# Patient Record
Sex: Female | Born: 1975 | Hispanic: Yes | Marital: Married | Smoking: Never smoker
Health system: Southern US, Community
[De-identification: ages and names within clinical notes are randomized; demographics above are authoritative.]

## PROBLEM LIST (undated history)

## (undated) ENCOUNTER — Inpatient Hospital Stay: Payer: Self-pay

## (undated) DIAGNOSIS — I959 Hypotension, unspecified: Secondary | ICD-10-CM

## (undated) HISTORY — DX: Hypotension, unspecified: I95.9

## (undated) HISTORY — PX: CHOLECYSTECTOMY: SHX55

---

## 2011-11-30 ENCOUNTER — Ambulatory Visit: Payer: Self-pay | Admitting: Family Medicine

## 2012-09-10 ENCOUNTER — Ambulatory Visit: Payer: Self-pay | Admitting: Family

## 2015-08-04 ENCOUNTER — Emergency Department: Payer: BLUE CROSS/BLUE SHIELD

## 2015-08-04 ENCOUNTER — Encounter: Payer: Self-pay | Admitting: Emergency Medicine

## 2015-08-04 ENCOUNTER — Emergency Department: Payer: BLUE CROSS/BLUE SHIELD | Admitting: Anesthesiology

## 2015-08-04 ENCOUNTER — Observation Stay
Admission: EM | Admit: 2015-08-04 | Discharge: 2015-08-06 | Disposition: A | Discharge status: Home / Self Care | Payer: BLUE CROSS/BLUE SHIELD | Attending: Surgery | Admitting: Surgery

## 2015-08-04 ENCOUNTER — Encounter
Admission: EM | Disposition: A | Discharge status: Home / Self Care | Payer: Self-pay | Source: Home / Self Care | Attending: Emergency Medicine

## 2015-08-04 ENCOUNTER — Ambulatory Visit: Admit: 2015-08-04 | Payer: Self-pay | Admitting: Surgery

## 2015-08-04 DIAGNOSIS — R1031 Right lower quadrant pain: Secondary | ICD-10-CM

## 2015-08-04 DIAGNOSIS — Z87891 Personal history of nicotine dependence: Secondary | ICD-10-CM | POA: Insufficient documentation

## 2015-08-04 DIAGNOSIS — O9989 Other specified diseases and conditions complicating pregnancy, childbirth and the puerperium: Secondary | ICD-10-CM

## 2015-08-04 DIAGNOSIS — Z3A16 16 weeks gestation of pregnancy: Secondary | ICD-10-CM | POA: Insufficient documentation

## 2015-08-04 DIAGNOSIS — O99891 Other specified diseases and conditions complicating pregnancy: Secondary | ICD-10-CM

## 2015-08-04 DIAGNOSIS — Z880 Allergy status to penicillin: Secondary | ICD-10-CM | POA: Diagnosis not present

## 2015-08-04 DIAGNOSIS — K3580 Unspecified acute appendicitis: Secondary | ICD-10-CM | POA: Diagnosis not present

## 2015-08-04 DIAGNOSIS — O99612 Diseases of the digestive system complicating pregnancy, second trimester: Secondary | ICD-10-CM | POA: Diagnosis not present

## 2015-08-04 DIAGNOSIS — K3589 Other acute appendicitis: Secondary | ICD-10-CM

## 2015-08-04 DIAGNOSIS — F17210 Nicotine dependence, cigarettes, uncomplicated: Secondary | ICD-10-CM | POA: Insufficient documentation

## 2015-08-04 DIAGNOSIS — R8271 Bacteriuria: Secondary | ICD-10-CM | POA: Insufficient documentation

## 2015-08-04 DIAGNOSIS — O26892 Other specified pregnancy related conditions, second trimester: Secondary | ICD-10-CM | POA: Diagnosis not present

## 2015-08-04 DIAGNOSIS — O99619 Diseases of the digestive system complicating pregnancy, unspecified trimester: Secondary | ICD-10-CM

## 2015-08-04 DIAGNOSIS — Z9049 Acquired absence of other specified parts of digestive tract: Secondary | ICD-10-CM | POA: Diagnosis not present

## 2015-08-04 HISTORY — PX: LAPAROSCOPIC APPENDECTOMY: SHX408

## 2015-08-04 LAB — URINALYSIS COMPLETE WITH MICROSCOPIC (ARMC ONLY)
BILIRUBIN URINE: NEGATIVE
Glucose, UA: NEGATIVE mg/dL
KETONES UR: NEGATIVE mg/dL
Nitrite: NEGATIVE
Protein, ur: NEGATIVE mg/dL
SPECIFIC GRAVITY, URINE: 1.004 — AB (ref 1.005–1.030)
pH: 6 (ref 5.0–8.0)

## 2015-08-04 LAB — CBC
HCT: 37.2 % (ref 35.0–47.0)
Hemoglobin: 12.8 g/dL (ref 12.0–16.0)
MCH: 30.7 pg (ref 26.0–34.0)
MCHC: 34.3 g/dL (ref 32.0–36.0)
MCV: 89.5 fL (ref 80.0–100.0)
PLATELETS: 254 10*3/uL (ref 150–440)
RBC: 4.16 MIL/uL (ref 3.80–5.20)
RDW: 13.3 % (ref 11.5–14.5)
WBC: 12.5 10*3/uL — ABNORMAL HIGH (ref 3.6–11.0)

## 2015-08-04 LAB — HCG, QUANTITATIVE, PREGNANCY: hCG, Beta Chain, Quant, S: 23780 m[IU]/mL — ABNORMAL HIGH (ref <5)

## 2015-08-04 LAB — WET PREP, GENITAL
Clue Cells Wet Prep HPF POC: NONE SEEN
TRICH WET PREP: NONE SEEN

## 2015-08-04 LAB — COMPREHENSIVE METABOLIC PANEL
ALBUMIN: 3.3 g/dL — AB (ref 3.5–5.0)
ALK PHOS: 63 U/L (ref 38–126)
ALT: 28 U/L (ref 14–54)
AST: 19 U/L (ref 15–41)
Anion gap: 7 (ref 5–15)
BUN: 8 mg/dL (ref 6–20)
CHLORIDE: 105 mmol/L (ref 101–111)
CO2: 23 mmol/L (ref 22–32)
CREATININE: 0.47 mg/dL (ref 0.44–1.00)
Calcium: 9 mg/dL (ref 8.9–10.3)
GFR calc non Af Amer: >60 mL/min (ref >60)
GLUCOSE: 93 mg/dL (ref 65–99)
Potassium: 3.5 mmol/L (ref 3.5–5.1)
SODIUM: 135 mmol/L (ref 135–145)
Total Bilirubin: 0.1 mg/dL — ABNORMAL LOW (ref 0.3–1.2)
Total Protein: 7.4 g/dL (ref 6.5–8.1)

## 2015-08-04 LAB — LIPASE, BLOOD: LIPASE: 28 U/L (ref 11–51)

## 2015-08-04 LAB — CHLAMYDIA/NGC RT PCR (ARMC ONLY)
CHLAMYDIA TR: NOT DETECTED
N GONORRHOEAE: NOT DETECTED

## 2015-08-04 SURGERY — APPENDECTOMY, LAPAROSCOPIC
Anesthesia: General | Site: Abdomen | Wound class: Contaminated

## 2015-08-04 MED ORDER — FENTANYL CITRATE (PF) 100 MCG/2ML IJ SOLN
INTRAMUSCULAR | Status: DC | PRN
  Administered 2015-08-04: 100 ug via INTRAVENOUS
  Administered 2015-08-04 (×2): 25 ug via INTRAVENOUS

## 2015-08-04 MED ORDER — CIPROFLOXACIN IN D5W 400 MG/200ML IV SOLN
400.0000 mg | Freq: Once | INTRAVENOUS | Status: AC
  Administered 2015-08-04: 400 mg via INTRAVENOUS
  Filled 2015-08-04: qty 200

## 2015-08-04 MED ORDER — ONDANSETRON HCL 4 MG/2ML IJ SOLN
4.0000 mg | Freq: Once | INTRAMUSCULAR | Status: AC | PRN
  Administered 2015-08-04: 4 mg via INTRAVENOUS

## 2015-08-04 MED ORDER — MORPHINE SULFATE (PF) 2 MG/ML IV SOLN
2.0000 mg | INTRAVENOUS | Status: DC | PRN
  Administered 2015-08-04 – 2015-08-05 (×3): 2 mg via INTRAVENOUS
  Filled 2015-08-04 (×3): qty 1

## 2015-08-04 MED ORDER — BUPIVACAINE-EPINEPHRINE (PF) 0.25% -1:200000 IJ SOLN
INTRAMUSCULAR | Status: DC | PRN
  Administered 2015-08-04: 30 mL

## 2015-08-04 MED ORDER — HYDROMORPHONE HCL 1 MG/ML IJ SOLN
0.5000 mg | Freq: Once | INTRAMUSCULAR | Status: AC
  Administered 2015-08-04: 0.5 mg via INTRAVENOUS

## 2015-08-04 MED ORDER — FENTANYL CITRATE (PF) 100 MCG/2ML IJ SOLN
INTRAMUSCULAR | Status: AC
  Filled 2015-08-04: qty 2

## 2015-08-04 MED ORDER — NITROFURANTOIN MONOHYD MACRO 100 MG PO CAPS: 100.0000 mg | ORAL_CAPSULE | Freq: Two times a day (BID) | ORAL | Status: AC

## 2015-08-04 MED ORDER — EPHEDRINE SULFATE 50 MG/ML IJ SOLN
INTRAMUSCULAR | Status: DC | PRN
  Administered 2015-08-04: 5 mg via INTRAVENOUS

## 2015-08-04 MED ORDER — SUCCINYLCHOLINE CHLORIDE 20 MG/ML IJ SOLN
INTRAMUSCULAR | Status: DC | PRN
  Administered 2015-08-04: 100 mg via INTRAVENOUS

## 2015-08-04 MED ORDER — ROCURONIUM BROMIDE 100 MG/10ML IV SOLN
INTRAVENOUS | Status: DC | PRN
  Administered 2015-08-04: 30 mg via INTRAVENOUS

## 2015-08-04 MED ORDER — METRONIDAZOLE IN NACL 5-0.79 MG/ML-% IV SOLN
500.0000 mg | Freq: Once | INTRAVENOUS | Status: AC
  Administered 2015-08-04: 500 mg via INTRAVENOUS
  Filled 2015-08-04: qty 100

## 2015-08-04 MED ORDER — ONDANSETRON HCL 4 MG PO TABS: 4.0000 mg | ORAL_TABLET | Freq: Four times a day (QID) | ORAL | Status: DC | PRN

## 2015-08-04 MED ORDER — HEPARIN SODIUM (PORCINE) 5000 UNIT/ML IJ SOLN: 5000.0000 [IU] | Freq: Three times a day (TID) | INTRAMUSCULAR | Status: DC

## 2015-08-04 MED ORDER — ACETAMINOPHEN 325 MG PO TABS
650.0000 mg | ORAL_TABLET | Freq: Once | ORAL | Status: AC
  Administered 2015-08-04: 650 mg via ORAL
  Filled 2015-08-04: qty 2

## 2015-08-04 MED ORDER — ONDANSETRON HCL 4 MG/2ML IJ SOLN
4.0000 mg | Freq: Once | INTRAMUSCULAR | Status: AC
  Administered 2015-08-04: 4 mg via INTRAVENOUS
  Filled 2015-08-04: qty 2

## 2015-08-04 MED ORDER — LIDOCAINE HCL (CARDIAC) 20 MG/ML IV SOLN
INTRAVENOUS | Status: DC | PRN
  Administered 2015-08-04: 40 mg via INTRAVENOUS

## 2015-08-04 MED ORDER — GLYCOPYRROLATE 0.2 MG/ML IJ SOLN
INTRAMUSCULAR | Status: DC | PRN
  Administered 2015-08-04: .6 mg via INTRAVENOUS

## 2015-08-04 MED ORDER — HEPARIN SODIUM (PORCINE) 5000 UNIT/ML IJ SOLN
5000.0000 [IU] | Freq: Three times a day (TID) | INTRAMUSCULAR | Status: DC
  Administered 2015-08-04 – 2015-08-06 (×4): 5000 [IU] via SUBCUTANEOUS
  Filled 2015-08-04 (×4): qty 1

## 2015-08-04 MED ORDER — LACTATED RINGERS IV SOLN
INTRAVENOUS | Status: DC | PRN
  Administered 2015-08-04: 13:00:00 via INTRAVENOUS

## 2015-08-04 MED ORDER — FENTANYL CITRATE (PF) 100 MCG/2ML IJ SOLN
25.0000 ug | INTRAMUSCULAR | Status: DC | PRN
  Administered 2015-08-04 (×4): 25 ug via INTRAVENOUS

## 2015-08-04 MED ORDER — PROPOFOL 10 MG/ML IV BOLUS
INTRAVENOUS | Status: DC | PRN
  Administered 2015-08-04: 130 mg via INTRAVENOUS

## 2015-08-04 MED ORDER — LACTATED RINGERS IV SOLN
INTRAVENOUS | Status: DC
  Administered 2015-08-04 – 2015-08-05 (×2): via INTRAVENOUS

## 2015-08-04 MED ORDER — NEOSTIGMINE METHYLSULFATE 10 MG/10ML IV SOLN
INTRAVENOUS | Status: DC | PRN
  Administered 2015-08-04: 4 mg via INTRAVENOUS

## 2015-08-04 MED ORDER — ONDANSETRON HCL 4 MG/2ML IJ SOLN
4.0000 mg | Freq: Four times a day (QID) | INTRAMUSCULAR | Status: DC | PRN
Start: 2015-08-04 — End: 2015-08-06

## 2015-08-04 MED ORDER — HYDROCODONE-ACETAMINOPHEN 5-325 MG PO TABS
1.0000 | ORAL_TABLET | ORAL | Status: DC | PRN
  Administered 2015-08-04: 2 via ORAL
  Administered 2015-08-04 – 2015-08-05 (×3): 1 via ORAL
  Administered 2015-08-05 (×2): 2 via ORAL
  Administered 2015-08-05 – 2015-08-06 (×2): 1 via ORAL
  Filled 2015-08-04: qty 2
  Filled 2015-08-04: qty 1
  Filled 2015-08-04 (×2): qty 2
  Filled 2015-08-04 (×2): qty 1
  Filled 2015-08-04: qty 2
  Filled 2015-08-04: qty 1

## 2015-08-04 MED ORDER — ONDANSETRON HCL 4 MG/2ML IJ SOLN
INTRAMUSCULAR | Status: AC
  Filled 2015-08-04: qty 2

## 2015-08-04 MED ORDER — SODIUM CHLORIDE 0.9 % IV BOLUS (SEPSIS)
1000.0000 mL | Freq: Once | INTRAVENOUS | Status: AC
  Administered 2015-08-04: 1000 mL via INTRAVENOUS

## 2015-08-04 MED ORDER — HYDROMORPHONE HCL 1 MG/ML IJ SOLN
1.0000 mg | Freq: Once | INTRAMUSCULAR | Status: DC
  Filled 2015-08-04: qty 1

## 2015-08-04 MED ORDER — NITROFURANTOIN MONOHYD MACRO 100 MG PO CAPS
100.0000 mg | ORAL_CAPSULE | Freq: Once | ORAL | Status: AC
  Administered 2015-08-04: 100 mg via ORAL
  Filled 2015-08-04: qty 1

## 2015-08-04 SURGICAL SUPPLY — 41 items
ADHESIVE MASTISOL STRL (MISCELLANEOUS) ×3 IMPLANT
APPLIER CLIP ROT 10 11.4 M/L (STAPLE) ×3
BLADE SURG SZ11 CARB STEEL (BLADE) ×3 IMPLANT
CANISTER SUCT 3000ML (MISCELLANEOUS) ×3 IMPLANT
CATH TRAY 16F METER LATEX (MISCELLANEOUS) ×3 IMPLANT
CHLORAPREP W/TINT 26ML (MISCELLANEOUS) ×3 IMPLANT
CLIP APPLIE ROT 10 11.4 M/L (STAPLE) ×1 IMPLANT
CLOSURE WOUND 1/2 X4 (GAUZE/BANDAGES/DRESSINGS) ×1
CUTTER LINEAR ENDO 35 ART THIN (STAPLE) ×3 IMPLANT
DEVICE TROCAR PUNCTURE CLOSURE (ENDOMECHANICALS) ×3 IMPLANT
ENDOPOUCH RETRIEVER 10 (MISCELLANEOUS) ×3 IMPLANT
GAUZE SPONGE NON-WVN 2X2 STRL (MISCELLANEOUS) ×3 IMPLANT
GLOVE BIO SURGEON STRL SZ8 (GLOVE) ×21 IMPLANT
GOWN STRL REUS W/ TWL LRG LVL3 (GOWN DISPOSABLE) ×4 IMPLANT
GOWN STRL REUS W/TWL LRG LVL3 (GOWN DISPOSABLE) ×8
IRRIGATION STRYKERFLOW (MISCELLANEOUS) ×1 IMPLANT
IRRIGATOR STRYKERFLOW (MISCELLANEOUS) ×3
KIT RM TURNOVER STRD PROC AR (KITS) ×3 IMPLANT
LABEL OR SOLS (LABEL) IMPLANT
NDL SAFETY 22GX1.5 (NEEDLE) ×3 IMPLANT
NEEDLE VERESS 14GA 120MM (NEEDLE) ×3 IMPLANT
NS IRRIG 500ML POUR BTL (IV SOLUTION) ×3 IMPLANT
PACK LAP CHOLECYSTECTOMY (MISCELLANEOUS) ×3 IMPLANT
PAD GROUND ADULT SPLIT (MISCELLANEOUS) ×3 IMPLANT
RELOAD /EVU35 (ENDOMECHANICALS) IMPLANT
RELOAD CUTTER ETS 35MM STAND (ENDOMECHANICALS) ×3 IMPLANT
SCISSORS METZENBAUM CVD 33 (INSTRUMENTS) ×3 IMPLANT
SLEEVE ENDOPATH XCEL 5M (ENDOMECHANICALS) ×3 IMPLANT
SOL .9 NS 3000ML IRR  AL (IV SOLUTION) ×2
SOL .9 NS 3000ML IRR UROMATIC (IV SOLUTION) ×1 IMPLANT
SPONGE LAP 18X18 5 PK (GAUZE/BANDAGES/DRESSINGS) ×3 IMPLANT
SPONGE VERSALON 2X2 STRL (MISCELLANEOUS) ×6
STRIP CLOSURE SKIN 1/2X4 (GAUZE/BANDAGES/DRESSINGS) ×2 IMPLANT
SUT MNCRL 4-0 (SUTURE) ×4
SUT MNCRL 4-0 27XMFL (SUTURE) ×2
SUT VICRYL 0 TIES 12 18 (SUTURE) ×3 IMPLANT
SUTURE MNCRL 4-0 27XMF (SUTURE) ×2 IMPLANT
TRAP SPECIMEN MUCOUS 40CC (MISCELLANEOUS) IMPLANT
TROCAR XCEL 12X100 BLDLESS (ENDOMECHANICALS) ×3 IMPLANT
TROCAR XCEL NON-BLD 5MMX100MML (ENDOMECHANICALS) ×3 IMPLANT
TUBING INSUFFLATOR HI FLOW (MISCELLANEOUS) ×3 IMPLANT

## 2015-08-04 NOTE — H&P (Signed)
Katherine Eaton is an 39 y.o. female.    Chief Complaint: Right lower quadrant pain  HPI: This patient with 3 days right lower quadrant pain which is been worsening she denies fevers or chills she is [redacted] weeks pregnant. No official interpreter was present but her husband was present and was able to interpret quite easily for me. A formal interpreter will be utilized for repeating consent issues later. She denies nausea or vomiting denies fevers or chills has never had an episode like this before this is her first pregnancy pain does not radiate denies dysuria  History reviewed. No pertinent past medical history.  Past Surgical History  Procedure Laterality Date  . Cholecystectomy      History reviewed. No pertinent family history. Social History:  reports that she has quit smoking. She does not have any smokeless tobacco history on file. She reports that she does not drink alcohol. Her drug history is not on file.  Allergies:  Allergies  Allergen Reactions  . Penicillins Anaphylaxis     (Not in a hospital admission)   Review of Systems  Constitutional: Negative for fever and chills.  HENT: Negative.   Eyes: Negative.   Respiratory: Negative.   Cardiovascular: Negative.   Gastrointestinal: Positive for abdominal pain. Negative for heartburn, nausea, vomiting, diarrhea, constipation, blood in stool and melena.  Genitourinary: Negative.  Negative for dysuria and urgency.  Musculoskeletal: Negative.   Neurological: Negative.   Endo/Heme/Allergies: Negative.   Psychiatric/Behavioral: Negative.      Physical Exam:  BP 113/72 mmHg  Pulse 77  Temp(Src) 98.1 F (36.7 C) (Oral)  Resp 18  Ht '5\' 2"'  (1.575 m)  Wt 169 lb 12.1 oz (77 kg)  BMI 31.04 kg/m2  SpO2 97%  LMP 04/14/2015  Physical Exam  Constitutional: She is oriented to person, place, and time and well-developed, well-nourished, and in no distress. No distress.  Appears comfortable  HENT:  Head:  Normocephalic and atraumatic.  Eyes: Pupils are equal, round, and reactive to light. Right eye exhibits no discharge. Left eye exhibits no discharge. No scleral icterus.  Neck: Normal range of motion. Neck supple.  Cardiovascular: Normal rate, regular rhythm and normal heart sounds.   Pulmonary/Chest: Effort normal and breath sounds normal. No respiratory distress. She has no wheezes. She has no rales.  Abdominal: Soft. She exhibits no distension. There is tenderness. There is no rebound and no guarding.  Tender right lower quadrant at McBurney's point fundus palpable in the infraumbilical area No guarding no rebound no percussion tenderness  Musculoskeletal: Normal range of motion. She exhibits no edema.  Lymphadenopathy:    She has no cervical adenopathy.  Neurological: She is alert and oriented to person, place, and time.  Skin: Skin is warm and dry. No erythema.  Psychiatric: Affect normal.        Results for orders placed or performed during the hospital encounter of 08/04/15 (from the past 48 hour(s))  hCG, quantitative, pregnancy     Status: Abnormal   Collection Time: 08/04/15  5:20 AM  Result Value Ref Range   hCG, Beta Chain, Quant, S 23780 (H) <5 mIU/mL    Comment:          GEST. AGE      CONC.  (mIU/mL)   <=1 WEEK        5 - 50     2 WEEKS       50 - 500     3 WEEKS  100 - 10,000     4 WEEKS     1,000 - 30,000     5 WEEKS     3,500 - 115,000   6-8 WEEKS     12,000 - 270,000    12 WEEKS     15,000 - 220,000        FEMALE AND NON-PREGNANT FEMALE:     LESS THAN 5 mIU/mL   Lipase, blood     Status: None   Collection Time: 08/04/15  5:28 AM  Result Value Ref Range   Lipase 28 11 - 51 U/L    Comment: Please note change in reference range.  Comprehensive metabolic panel     Status: Abnormal   Collection Time: 08/04/15  5:28 AM  Result Value Ref Range   Sodium 135 135 - 145 mmol/L   Potassium 3.5 3.5 - 5.1 mmol/L   Chloride 105 101 - 111 mmol/L   CO2 23 22 - 32  mmol/L   Glucose, Bld 93 65 - 99 mg/dL   BUN 8 6 - 20 mg/dL   Creatinine, Ser 0.47 0.44 - 1.00 mg/dL   Calcium 9.0 8.9 - 10.3 mg/dL   Total Protein 7.4 6.5 - 8.1 g/dL   Albumin 3.3 (L) 3.5 - 5.0 g/dL   AST 19 15 - 41 U/L   ALT 28 14 - 54 U/L   Alkaline Phosphatase 63 38 - 126 U/L   Total Bilirubin 0.1 (L) 0.3 - 1.2 mg/dL   GFR calc non Af Amer >60 >60 mL/min   GFR calc Af Amer >60 >60 mL/min    Comment: (NOTE) The eGFR has been calculated using the CKD EPI equation. This calculation has not been validated in all clinical situations. eGFR's persistently <60 mL/min signify possible Chronic Kidney Disease.    Anion gap 7 5 - 15  CBC     Status: Abnormal   Collection Time: 08/04/15  5:28 AM  Result Value Ref Range   WBC 12.5 (H) 3.6 - 11.0 K/uL   RBC 4.16 3.80 - 5.20 MIL/uL   Hemoglobin 12.8 12.0 - 16.0 g/dL   HCT 37.2 35.0 - 47.0 %   MCV 89.5 80.0 - 100.0 fL   MCH 30.7 26.0 - 34.0 pg   MCHC 34.3 32.0 - 36.0 g/dL   RDW 13.3 11.5 - 14.5 %   Platelets 254 150 - 440 K/uL  Urinalysis complete, with microscopic ([facility] only)     Status: Abnormal   Collection Time: 08/04/15  5:47 AM  Result Value Ref Range   Color, Urine YELLOW (A) YELLOW   APPearance CLEAR (A) CLEAR   Glucose, UA NEGATIVE NEGATIVE mg/dL   Bilirubin Urine NEGATIVE NEGATIVE   Ketones, ur NEGATIVE NEGATIVE mg/dL   Specific Gravity, Urine 1.004 (L) 1.005 - 1.030   Hgb urine dipstick 1+ (A) NEGATIVE   pH 6.0 5.0 - 8.0   Protein, ur NEGATIVE NEGATIVE mg/dL   Nitrite NEGATIVE NEGATIVE   Leukocytes, UA TRACE (A) NEGATIVE   RBC / HPF 0-5 0 - 5 RBC/hpf   WBC, UA 0-5 0 - 5 WBC/hpf   Bacteria, UA MANY (A) NONE SEEN   Squamous Epithelial / LPF 6-30 (A) NONE SEEN  Wet prep, genital     Status: Abnormal   Collection Time: 08/04/15  5:56 AM  Result Value Ref Range   Yeast Wet Prep HPF POC FEW (A) NONE SEEN   Trich, Wet Prep NONE SEEN NONE SEEN   Clue Cells Wet Prep  HPF POC NONE SEEN NONE SEEN   WBC, Wet Prep HPF POC  MODERATE (A) NONE SEEN  Chlamydia/NGC rt PCR ([facility] only)     Status: None   Collection Time: 08/04/15  5:56 AM  Result Value Ref Range   Specimen source GC/Chlam ENDOCERVICAL    Chlamydia Tr NOT DETECTED NOT DETECTED   N gonorrhoeae NOT DETECTED NOT DETECTED    Comment: (NOTE) 100  This methodology has not been evaluated in pregnant women or in 200  patients with a history of hysterectomy. 300 400  This methodology will not be performed on patients less than 52  years of age.    US Ob Limited  08/04/2015  CLINICAL DATA:  Right lower quadrant pain EXAM: LIMITED OBSTETRIC ULTRASOUND FINDINGS: Number of Fetuses: 1 Heart Rate:  147 bpm Movement: Yes Presentation: Cephalic Placental Location: Anterior Previa: No Amniotic Fluid (Subjective):  Within normal limits. BPD:  3.39cm 16w  3d MATERNAL FINDINGS: Cervix:  Appears closed. Uterus/Adnexae:  No abnormality visualized. IMPRESSION: Approximately 16 week intrauterine pregnancy. Fetal heart rate 147 beats per minute. No acute maternal findings. This exam is performed on an emergent basis and does not comprehensively evaluate fetal size, dating, or anatomy; follow-up complete OB US should be considered if further fetal assessment is warranted. Electronically Signed   By: Rolm Baptise M.D.   On: 08/04/2015 07:42   US Abdomen Limited  08/04/2015  CLINICAL DATA:  39 year old female who was [redacted] weeks pregnant with right lower quadrant pain. Elevated white count. Subsequent encounter. EXAM: US ABDOMEN LIMITED - RIGHT LOWER QUADRANT COMPARISON:  Pelvic sonogram same date. FINDINGS: Within the right lower quadrant, there is a tubular structure which appears blind-ending containing fluid measuring up to 5.4 mm as would be expected with the appendix. This is not compress with graded ultrasound compression and the patient was tender over this region despite the presence of pain medication. This raises possibility of appendicitis. Clinical correlation  recommended. No surrounding fluid collection. IMPRESSION: Possibility of appendicitis is raised as detailed above. Clinical correlation will be necessary for further delineation. These results were called by telephone at the time of interpretation on 08/04/2015 at 10:27 am to Dr. Eula Listen , who verbally acknowledged these results. Electronically Signed   By: Genia Del M.D.   On: 08/04/2015 10:38     Assessment/Plan  Ultrasound personally reviewed showing a noncompressible and dilated appendix with some thickening of the appendix cecal wall this is consistent with appendicitis as is her history and physical exam. I recommended via her husband as interpreter the need for a laparoscopic versus open appendectomy I also discussed with her the risks of bleeding infection and laparoscopy conversion to an open procedure and the risk of miscarriage which made her quite tearful as this is her first pregnancy. I told she and her husband as well as the ER facility that I would obtain a formal interpreter to go over this consent issues again upstairs. Her in agreement with this plan. Anabiotic's of been instituted DVT prophylaxis will be instituted as well  Florene Glen, MD, FACS

## 2015-08-04 NOTE — Progress Notes (Signed)
Patient seen in the preop holding area Via an interpreter I reviewed again the rationale for offering surgery the likely diagnosis and the risks of bleeding infection negative laparoscopy conversion to an open procedure and the risks to her 14 week pregnancy including spontaneous abortion and miscarriage. This been reviewed by anesthesia as well. Questions were answered for her via the interpreter as well as with her husband they understood and agreed to proceed with this plan.

## 2015-08-04 NOTE — Op Note (Signed)
laparascopic appendectomy   Randall Hiss Date of operation:  08/04/2015  Indications: The patient presented with a history of  abdominal pain. Workup has revealed findings consistent with acute appendicitis. She is [redacted] weeks pregnant and a workup with ultrasound has shown acute appendicitis.  Pre-operative Diagnosis: Acute appendicitis  Post-operative Diagnosis: Acute appendicitis nonruptured  Surgeon: Jerrol Banana. Burt Knack, MD, FACS Asst. Dr. Marta Lamas Anesthesia: General with endotracheal tube  Procedure Details  The patient was seen again in the preop area. The options of surgery versus observation were reviewed with the patient and/or family. The risks of bleeding, infection, recurrence of symptoms, negative laparoscopy, potential for an open procedure, bowel injury, abscess or infection, were all reviewed as well. The patient was taken to Operating Room, identified as Katherine Eaton and the procedure verified as laparoscopic appendectomy. A Time Out was held and the above information confirmed.  The patient was placed in the supine position and general anesthesia was induced.  Antibiotic prophylaxis was administered and VT E prophylaxis was in place. A Foley catheter was placed by the nursing staff.   The abdomen was prepped and draped in a sterile fashion. A supraumbilical incision was made. . A 5 mm trocar port was placed utilizing the Visiport type approach without difficulty and the abdominal cavity was explored.  Under direct vision a 5 mm left lateral port was placed and a 13 mm right upper quadrant port was placed all under direct vision.  The appendix was identified and found to be acutely inflamed but not ruptured  The appendix was carefully dissected. The base of the appendix was dissected out and divided with a standard load Endo GIA. The mesoappendix was divided with a vascular load Endo GIA. The appendix was passed out through the right upper quadrant port site .Marland Kitchen  The right lower quadrant  was then irrigated with copious amounts of normal saline which was aspirated. Inspection  some bleeding. Loops were placed on the staple line to control mild hemorrhage. and there were no signs of bowel injury. Therefore the right upper quadrant port site was closed under direct vision utilizing an Endo Close technique with 0 Vicryl interrupted sutures, all under direct vision.   Again the right lower quadrant was inspected there was no sign of bleeding or bowel injury therefore pneumoperitoneum was released, all ports were removed and the skin incisions were approximated with subcuticular 4-0 Monocryl. Steri-Strips and Mastisol and sterile dressings were placed.  The patient tolerated the procedure well, there were no complications. The sponge lap and needle count were correct at the end of the procedure.  The patient was taken to the recovery room in stable condition to be admitted for continued care.  Findings: Acute appendicitis nonruptured  Estimated Blood Loss: 20 cc                  Specimens: appendix         Complications:  None                  Arshia Rondon E. Burt Knack MD, FACS

## 2015-08-04 NOTE — Progress Notes (Signed)
FHR 128. Obtained per Eugene Garnet RN.

## 2015-08-04 NOTE — Addendum Note (Signed)
Addendum  created 08/04/15 1444 by Gunnar Fusi, MD   Modules edited: Anesthesia Events, Narrator   Narrator:  Narrator: Event Log Edited

## 2015-08-04 NOTE — Anesthesia Preprocedure Evaluation (Signed)
Anesthesia Evaluation  Patient identified by MRN, date of birth, ID band Patient awake    Reviewed: Allergy & Precautions, NPO status , Patient's Chart, lab work & pertinent test results  History of Anesthesia Complications Negative for: history of anesthetic complications  Airway Mallampati: II       Dental  (+) Partial Upper   Pulmonary neg pulmonary ROS, Current Smoker,           Cardiovascular negative cardio ROS       Neuro/Psych negative neurological ROS     GI/Hepatic negative GI ROS, Neg liver ROS,   Endo/Other  negative endocrine ROS  Renal/GU negative Renal ROS     Musculoskeletal   Abdominal   Peds  Hematology negative hematology ROS (+)   Anesthesia Other Findings   Reproductive/Obstetrics (+) Pregnancy                             Anesthesia Physical Anesthesia Plan  ASA: II  Anesthesia Plan: General   Post-op Pain Management:    Induction: Intravenous  Airway Management Planned: Oral ETT  Additional Equipment:   Intra-op Plan:   Post-operative Plan:   Informed Consent: I have reviewed the patients History and Physical, chart, labs and discussed the procedure including the risks, benefits and alternatives for the proposed anesthesia with the patient or authorized representative who has indicated his/her understanding and acceptance.     Plan Discussed with:   Anesthesia Plan Comments:         Anesthesia Quick Evaluation

## 2015-08-04 NOTE — Progress Notes (Signed)
Interpreter at bedside.

## 2015-08-04 NOTE — Anesthesia Procedure Notes (Signed)
Procedure Name: Intubation Date/Time: 08/04/2015 1:15 PM Performed by: Allean Found Pre-anesthesia Checklist: Patient identified, Emergency Drugs available, Suction available, Patient being monitored and Timeout performed Patient Re-evaluated:Patient Re-evaluated prior to inductionOxygen Delivery Method: Circle system utilized Preoxygenation: Pre-oxygenation with 100% oxygen Intubation Type: IV induction and Rapid sequence Laryngoscope Size: Mac and 3 Grade View: Grade I Tube type: Oral Number of attempts: 1 Airway Equipment and Method: Stylet Placement Confirmation: ETT inserted through vocal cords under direct vision,  positive ETCO2 and breath sounds checked- equal and bilateral Secured at: 21 cm Tube secured with: Tape Dental Injury: Teeth and Oropharynx as per pre-operative assessment

## 2015-08-04 NOTE — ED Notes (Signed)
pt reports [redacted] weeks pregnant and last night approx 2200 started having lower abdominal pain that is getting worse, denies vaginal bleeding.

## 2015-08-04 NOTE — ED Provider Notes (Signed)
[facility] Emergency Department Provider Note  ____________________________________________  Time seen: Approximately 086 AM  I have reviewed the triage vital signs and the nursing notes.   HISTORY  Chief Complaint Abdominal Pain    HPI Katherine Eaton is a 39 y.o. female who comes into the hospital with abdominal pain. The patient reports that she is [redacted] weeks pregnant. She reports that the pain started on Saturday and is in her right lower quadrant. Tonight the pain was worse and she was unable to sleep. The patient reports that she seen a medical doctor and has not yet seen an OB/GYN. She reports that she's listened to the heartbeat but has not done an ultrasound. She reports that 2 months ago she had bleeding but has been fine since. The patient denies any nausea or vomiting. She reports that last weekend she also had some vertigo but it went away on its own. The patient rates her pain an 8 out of 10 in intensity. The patient is a G1 P0 and her last menstrual cycle was 04/14/15. The patient also feels occasionally short of breath. The patient was concerned due to the intensity of the pain so she decided to come in for evaluation.   History reviewed. No pertinent past medical history.  There are no active problems to display for this patient.   Past Surgical History  Procedure Laterality Date  . Cholecystectomy      No current outpatient prescriptions on file.  Allergies Penicillins  History reviewed. No pertinent family history.  Social History Social History  Substance Use Topics  . Smoking status: Former Research scientist (life sciences)  . Smokeless tobacco: None  . Alcohol Use: No    Review of Systems Constitutional: No fever/chills Eyes: No visual changes. ENT: No sore throat. Cardiovascular: Denies chest pain. Respiratory: Occasional shortness of breath. Gastrointestinal:  abdominal pain.  No nausea, no vomiting.  No diarrhea.  No  constipation. Genitourinary: Negative for dysuria. Musculoskeletal: Negative for back pain. Skin: Negative for rash. Neurological: Negative for headaches, focal weakness or numbness.  10-point ROS otherwise negative.  ____________________________________________   PHYSICAL EXAM:  VITAL SIGNS: ED Triage Vitals  Enc Vitals Group     BP 08/04/15 0515 124/73 mmHg     Pulse Rate 08/04/15 0515 96     Resp 08/04/15 0515 20     Temp 08/04/15 0515 97.8 F (36.6 C)     Temp Source 08/04/15 0515 Oral     SpO2 08/04/15 0515 99 %     Weight 08/04/15 0515 169 lb 12.1 oz (77 kg)     Height 08/04/15 0515 5\' 2"  (1.575 m)     Head Cir --      Peak Flow --      Pain Score 08/04/15 0516 8     Pain Loc --      Pain Edu? --      Excl. in [place]? --     Constitutional: Alert and oriented. Well appearing and in moderate distress. Eyes: Conjunctivae are normal. PERRL. EOMI. Head: Atraumatic. Nose: No congestion/rhinnorhea. Mouth/Throat: Mucous membranes are moist.  Oropharynx non-erythematous. Cardiovascular: Normal rate, regular rhythm. Grossly normal heart sounds.  Good peripheral circulation. Respiratory: Normal respiratory effort.  No retractions. Lungs CTAB. Gastrointestinal: Soft with right lower quadrant tenderness to palpation No distention. Positive bowel sounds Genitourinary: Normal external genitalia, mild vaginal discharge, gravid uterus up to the umbilicus, right adnexal tenderness to palpation. Musculoskeletal: No lower extremity tenderness nor edema.   Neurologic:  Normal speech and language. No gross focal neurologic deficits are appreciated.  Skin:  Skin is warm, dry and intact.  Psychiatric: Mood and affect are normal.   ____________________________________________   LABS (all labs ordered are listed, but only abnormal results are displayed)  Labs Reviewed  WET PREP, GENITAL - Abnormal; Notable for the following:    Yeast Wet Prep HPF POC FEW (*)    WBC, Wet Prep HPF POC  MODERATE (*)    All other components within normal limits  COMPREHENSIVE METABOLIC PANEL - Abnormal; Notable for the following:    Albumin 3.3 (*)    Total Bilirubin 0.1 (*)    All other components within normal limits  CBC - Abnormal; Notable for the following:    WBC 12.5 (*)    All other components within normal limits  HCG, QUANTITATIVE, PREGNANCY - Abnormal; Notable for the following:    hCG, Beta Chain, Quant, S 23780 (*)    All other components within normal limits  URINALYSIS COMPLETEWITH MICROSCOPIC ([facility] ONLY) - Abnormal; Notable for the following:    Color, Urine YELLOW (*)    APPearance CLEAR (*)    Specific Gravity, Urine 1.004 (*)    Hgb urine dipstick 1+ (*)    Leukocytes, UA TRACE (*)    Bacteria, UA MANY (*)    Squamous Epithelial / LPF 6-30 (*)    All other components within normal limits  CHLAMYDIA/NGC RT PCR ([facility] ONLY)  LIPASE, BLOOD   ____________________________________________  EKG  None ____________________________________________  RADIOLOGY  Ultrasound ____________________________________________   PROCEDURES  Procedure(s) performed: None  Critical Care performed: No  ____________________________________________   INITIAL IMPRESSION / ASSESSMENT AND PLAN / ED COURSE  Pertinent labs & imaging results that were available during my care of the patient were reviewed by me and considered in my medical decision making (see chart for details).  This is a 39 year old female who is [redacted] weeks pregnant and comes in with right lower quadrant pain. The patient does have a white blood cell count of 12.5 and she does have bacteria in her urine with a concern for asymptomatic bacteriuria. I will await the results of the ultrasound and I will give the patient some Tylenol for her pain. I will reassess the patient once I have received her ultrasound.  The patient's care will be signed out to Dr. Mariea Clonts will follow-up the results of the ultrasound  and reevaluate the patient. Although the patient is pregnant appendicitis is on the differential so we will determine if the patient's pain is improving and determine if there needs to be further evaluation. ____________________________________________   FINAL CLINICAL IMPRESSION(S) / ED DIAGNOSES  Final diagnoses:  Right lower quadrant abdominal pain  Asymptomatic bacteriuria during pregnancy in second trimester      Loney Hering, MD 08/04/15 [phone]

## 2015-08-04 NOTE — ED Notes (Addendum)
Pt ambulated to treatment room with steady gait. Pt presents to ED with c/o lower abdominal pain x 2 weeks. Pt denies urinary symptoms, nausea, vomiting, diarrhea, fevers, or chest pain. Pt c/o shortness of breath x 1 month, states was seen at PCP and was told "it might be a problem with my thyroid." Pt reports x 2 days constipation, reports ate prunes and had BM on 08/03/15 in the morning. Pt is currently [redacted] weeks pregnant, reports having vaginal bleeding 2 weeks ago but denies bleeding at this time, G1P0. Pt alert and oriented x 4, no increased work in breathing, skin warm and dry. Call bell within reach.

## 2015-08-04 NOTE — Progress Notes (Signed)
Ready for d/c to room. Waiting on room availability.

## 2015-08-04 NOTE — Transfer of Care (Signed)
Immediate Anesthesia Transfer of Care Note  Patient: Katherine Eaton  Procedure(s) Performed: Procedure(s): APPENDECTOMY LAPAROSCOPIC (N/A)  Patient Location: PACU  Anesthesia Type:General  Level of Consciousness: sedated  Airway & Oxygen Therapy: Patient Spontanous Breathing and Patient connected to face mask oxygen  Post-op Assessment: Report given to RN and Post -op Vital signs reviewed and stable  Post vital signs: Reviewed and stable  Last Vitals:  Filed Vitals:   08/04/15 1105  BP: 113/72  Pulse:   Temp: 36.7 C  Resp:     Complications: No apparent anesthesia complications

## 2015-08-04 NOTE — Anesthesia Postprocedure Evaluation (Signed)
  Anesthesia Post-op Note  Patient: Katherine Eaton  Procedure(s) Performed: Procedure(s): APPENDECTOMY LAPAROSCOPIC (N/A)  Anesthesia type:General  Patient location: PACU  Post pain: Pain level controlled  Post assessment: Post-op Vital signs reviewed, Patient's Cardiovascular Status Stable, Respiratory Function Stable, Patent Airway and No signs of Nausea or vomiting  Post vital signs: Reviewed and stable  Last Vitals:  Filed Vitals:   08/04/15 1430  BP: 125/75  Pulse: 59  Temp:   Resp: 14    Level of consciousness: awake, alert  and patient cooperative  Complications: No apparent anesthesia complications

## 2015-08-04 NOTE — ED Notes (Signed)
Dr. Webster at bedside.  

## 2015-08-04 NOTE — ED Provider Notes (Signed)
39 y.o. female who is G1 P0 and [redacted] weeks pregnant by LMP presenting for 3 days of right lower quadrant pain with mild nausea. No fever or vomiting. No vaginal bleeding or gush of fluid. Since arriving in the emergency department, the patient has been afebrile, she has a white blood cell count of 12, and an ultrasound which shows an IUP and no other obvious gynecologic issues that would result in right lower quadrant pain. I have reevaluated the patient who states her pain is unchanged after Tylenol. I will have the nurse place an IV to give her fluid, stronger pain medication, and an antiemetic. I have ordered a limited ultrasound to evaluate for appendicitis, although ultrasound can be limited for appendicitis in adults. I have also contacted Dr. Burt Knack, general surgery, to evaluate the patient. He has requested that I also contact OB for additional evaluation. The patient is stable at this time, but does warrant further evaluation for possible appendicitis and pregnancy.  ----------------------------------------- 10:40 AM on 08/04/2015 -----------------------------------------  The patient does have an ultrasound which shows an appendix that is not compressible and slightly thick walled. I have talked to Dr. Burt Knack and Dr. Enzo Bi to let them know. Cipro and Flagyl have been ordered as the patient has anaphylaxis to penicillin so I will not give the patient a cephalosporin. At this time the patient is nothing by mouth and continues to have right lower quadrant pain despite Tylenol and Dilaudid. I will re-dose her to see if I can improve her pain.  Eula Listen, MD 08/04/15 1041

## 2015-08-04 NOTE — OR Nursing (Signed)
FHT located per doppler 138 on Right side, Dr cooper and Dr Ronelle Nigh in at bedside to discuss plan of care with interpreter

## 2015-08-05 MED ORDER — HYDROCODONE-ACETAMINOPHEN 5-325 MG PO TABS: 1.0000 | ORAL_TABLET | ORAL | Status: DC | PRN

## 2015-08-05 NOTE — Progress Notes (Signed)
Patient continues with mild nausea and pain Abdomen is soft nontender wounds are covered  Patient wants to stay overnight and I agree with that due to her nausea.

## 2015-08-05 NOTE — Discharge Instructions (Signed)
Remove dressing in 24 hours. °May shower in 24 hours. °Leave paper strips in place. °Resume all home medications. °Follow-up with Dr. Lanita Stammen in 10 days. °

## 2015-08-05 NOTE — Progress Notes (Signed)
1 Day Post-Op  Subjective: Status post laparoscopic appendectomy. She has no complaints today her pain is better she has no nausea or vomiting that was slightly nauseated after taking liquids.  Objective: Vital signs in last 24 hours: Temp:  [97.7 F (36.5 C)-98.2 F (36.8 C)] 97.7 F (36.5 C) (11/02 0439) Pulse Rate:  [59-84] 64 (11/02 0439) Resp:  [14-24] 20 (11/01 2005) BP: (94-131)/(50-75) 94/50 mmHg (11/02 0439) SpO2:  [80 %-100 %] 99 % (11/02 0439) Last BM Date:  (unsure)  Intake/Output from previous day: 11/01 0701 - 11/02 0700 In: 1912.5 [I.V.:1912.5] Out: 1495 [Urine:1475; Blood:20] Intake/Output this shift:    Physical exam:  Abdomen is soft and nontender wounds are covered and dressed properly without drainage. On tender calves.  Lab Results: CBC   Recent Labs  08/04/15 0528  WBC 12.5*  HGB 12.8  HCT 37.2  PLT 254   BMET  Recent Labs  08/04/15 0528  NA 135  K 3.5  CL 105  CO2 23  GLUCOSE 93  BUN 8  CREATININE 0.47  CALCIUM 9.0   PT/INR No results for input(s): LABPROT, INR in the last 72 hours. ABG No results for input(s): PHART, HCO3 in the last 72 hours.  Invalid input(s): PCO2, PO2  Studies/Results: US Ob Limited  08/04/2015  CLINICAL DATA:  Right lower quadrant pain EXAM: LIMITED OBSTETRIC ULTRASOUND FINDINGS: Number of Fetuses: 1 Heart Rate:  147 bpm Movement: Yes Presentation: Cephalic Placental Location: Anterior Previa: No Amniotic Fluid (Subjective):  Within normal limits. BPD:  3.39cm 16w  3d MATERNAL FINDINGS: Cervix:  Appears closed. Uterus/Adnexae:  No abnormality visualized. IMPRESSION: Approximately 16 week intrauterine pregnancy. Fetal heart rate 147 beats per minute. No acute maternal findings. This exam is performed on an emergent basis and does not comprehensively evaluate fetal size, dating, or anatomy; follow-up complete OB US should be considered if further fetal assessment is warranted. Electronically Signed   By: Rolm Baptise M.D.   On: 08/04/2015 07:42   US Abdomen Limited  08/04/2015  CLINICAL DATA:  39 year old female who was [redacted] weeks pregnant with right lower quadrant pain. Elevated white count. Subsequent encounter. EXAM: US ABDOMEN LIMITED - RIGHT LOWER QUADRANT COMPARISON:  Pelvic sonogram same date. FINDINGS: Within the right lower quadrant, there is a tubular structure which appears blind-ending containing fluid measuring up to 5.4 mm as would be expected with the appendix. This is not compress with graded ultrasound compression and the patient was tender over this region despite the presence of pain medication. This raises possibility of appendicitis. Clinical correlation recommended. No surrounding fluid collection. IMPRESSION: Possibility of appendicitis is raised as detailed above. Clinical correlation will be necessary for further delineation. These results were called by telephone at the time of interpretation on 08/04/2015 at 10:27 am to Dr. Eula Listen , who verbally acknowledged these results. Electronically Signed   By: Genia Del M.D.   On: 08/04/2015 10:38    Anti-infectives: Anti-infectives    Start     Dose/Rate Route Frequency Ordered Stop   08/04/15 1045  ciprofloxacin (CIPRO) IVPB 400 mg     400 mg 200 mL/hr over 60 Minutes Intravenous  Once 08/04/15 1040 08/04/15 1211   08/04/15 1045  metroNIDAZOLE (FLAGYL) IVPB 500 mg     500 mg 100 mL/hr over 60 Minutes Intravenous  Once 08/04/15 1040 08/04/15 1312      Assessment/Plan: s/p Procedure(s): APPENDECTOMY LAPAROSCOPIC   We'll obtain fetal heart tones prior to discharge and advance  diet today likely discharge later this afternoon.  Florene Glen, MD, FACS  08/05/2015

## 2015-08-06 LAB — SURGICAL PATHOLOGY

## 2015-08-06 NOTE — Discharge Summary (Signed)
Physician Discharge Summary  Patient ID: Katherine Eaton MRN: [id number] DOB/AGE: 04-04-1976 39 y.o.  Admit date: 08/04/2015 Discharge date: 08/06/2015   Discharge Diagnoses:  Active Problems:   Acute appendicitis   Procedures: Laparoscopic appendectomy  Hospital Course: This a patient who has signs of acute appendicitis and an ultrasound confirming it area she is [redacted] weeks pregnant. She was taken the operating room where laparoscopic appendectomy was performed without difficulty she made and on, K to postoperative recovery is discharged in stable condition to follow-up in our office in 10 days she is given mild narcotic for pain control with instructions to shower.  Consults: Obstetrics  Disposition: Final discharge disposition not confirmed     Medication List    TAKE these medications        HYDROcodone-acetaminophen 5-325 MG tablet  Commonly known as:  NORCO/VICODIN  Take 1-2 tablets by mouth every 4 (four) hours as needed for moderate pain.     nitrofurantoin (macrocrystal-monohydrate) 100 MG capsule  Commonly known as:  MACROBID  Take 1 capsule (100 mg total) by mouth 2 (two) times daily.     nitrofurantoin (macrocrystal-monohydrate) 100 MG capsule  Commonly known as:  MACROBID  Take 1 capsule (100 mg total) by mouth 2 (two) times daily.     prenatal multivitamin Tabs tablet  Take 1 tablet by mouth daily at 12 noon.           Follow-up Information    Follow up with Phoebe Perch, MD In 10 days.   Specialty:  Surgery   Why:  For wound re-check   Contact information:   [place] [id number] [phone]       Florene Glen, MD, FACS

## 2015-08-06 NOTE — Progress Notes (Signed)
Pt stable. IV removed. Spanish interpreter used for d/c instructions and education. Prescriptions given. Will contact MD for work note. Will be escorted out by staff.

## 2015-08-06 NOTE — Progress Notes (Signed)
2 Days Post-Op  Subjective: Status post laparoscopic appendectomy no complaints this morning tolerating a diet.  Objective: Vital signs in last 24 hours: Temp:  [98.3 F (36.8 C)-98.6 F (37 C)] 98.3 F (36.8 C) (11/03 2725) Pulse Rate:  [70-76] 70 (11/02 2147) Resp:  [16] 16 (11/02 1252) BP: (99-102)/(57-59) 102/58 mmHg (11/03 0614) SpO2:  [97 %-98 %] 98 % (11/02 2147) Last BM Date:  (unsure)  Intake/Output from previous day: 11/02 0701 - 11/03 0700 In: 10 [P.O.:10] Out: 1500 [Urine:1500] Intake/Output this shift: Total I/O In: 240 [P.O.:240] Out: -   Physical exam:  Abdomen is soft nontender wounds are clean no erythema  Lab Results: CBC   Recent Labs  08/04/15 0528  WBC 12.5*  HGB 12.8  HCT 37.2  PLT 254   BMET  Recent Labs  08/04/15 0528  NA 135  K 3.5  CL 105  CO2 23  GLUCOSE 93  BUN 8  CREATININE 0.47  CALCIUM 9.0   PT/INR No results for input(s): LABPROT, INR in the last 72 hours. ABG No results for input(s): PHART, HCO3 in the last 72 hours.  Invalid input(s): PCO2, PO2  Studies/Results: No results found.  Anti-infectives: Anti-infectives    Start     Dose/Rate Route Frequency Ordered Stop   08/04/15 1045  ciprofloxacin (CIPRO) IVPB 400 mg     400 mg 200 mL/hr over 60 Minutes Intravenous  Once 08/04/15 1040 08/04/15 1211   08/04/15 1045  metroNIDAZOLE (FLAGYL) IVPB 500 mg     500 mg 100 mL/hr over 60 Minutes Intravenous  Once 08/04/15 1040 08/05/15 1649      Assessment/Plan: s/p Procedure(s): APPENDECTOMY LAPAROSCOPIC   Patient doing very well recommend discharge today to follow-up in my office in 10 days she is on a regular diet may shower. His giving a mild analgesic for pain control.  Florene Glen, MD, FACS  08/06/2015

## 2015-08-11 ENCOUNTER — Telehealth: Payer: Self-pay

## 2015-08-11 NOTE — Telephone Encounter (Signed)
Patient's employer [employer] Katherine Eaton) called stating that whenever patient came in for her [place] appointment, to write her a letter letting him know when patient is able to return to work. I told him that I would once we see the patient. Mr. Katherine Eaton understood and had no further questions.

## 2015-08-12 ENCOUNTER — Encounter: Payer: Self-pay | Admitting: Obstetrics and Gynecology

## 2015-08-12 ENCOUNTER — Ambulatory Visit (INDEPENDENT_AMBULATORY_CARE_PROVIDER_SITE_OTHER): Payer: BLUE CROSS/BLUE SHIELD | Admitting: Obstetrics and Gynecology

## 2015-08-12 VITALS — BP 107/72 | HR 80 | Ht 62.0 in | Wt 171.8 lb

## 2015-08-12 DIAGNOSIS — Z3492 Encounter for supervision of normal pregnancy, unspecified, second trimester: Secondary | ICD-10-CM

## 2015-08-12 DIAGNOSIS — Z369 Encounter for antenatal screening, unspecified: Secondary | ICD-10-CM

## 2015-08-12 DIAGNOSIS — O09522 Supervision of elderly multigravida, second trimester: Secondary | ICD-10-CM

## 2015-08-12 DIAGNOSIS — Z3482 Encounter for supervision of other normal pregnancy, second trimester: Secondary | ICD-10-CM

## 2015-08-12 DIAGNOSIS — Z36 Encounter for antenatal screening of mother: Secondary | ICD-10-CM

## 2015-08-12 NOTE — Progress Notes (Signed)
TRANSFER IN OB HISTORY AND PHYSICAL  SUBJECTIVE:       Katherine Eaton is a 39 y.o. G1P0 female, Patient's last menstrual period was 04/14/2015., Estimated Date of Delivery: 01/09/2016, currently at [redacted]w[redacted]d, who presents today for Transition of Prenatal Care and f/u from recent hospital admission for appendicitis, s/p laparoscopic appendectomy last week.  Patient will be transferring care from [facility]. Complaints today include: pain at incision sites.  Is not taking pain medications as prescribed due to "not wanting to expose baby to too many narcotics".       Gynecologic History Patient's last menstrual period was 04/14/2015. Normal Contraception: none.  Discontinued OCPs ~ 1 year ago. Last Pap: 06/2015, normal.  Obstetric History OB History  Gravida Para Term Preterm AB SAB TAB Ectopic Multiple Living  1             # Outcome Date GA Lbr Len/2nd Weight Sex Delivery Anes PTL Lv  1 Current               Past Medical History  Diagnosis Date  . Hypotension     Past Surgical History  Procedure Laterality Date  . Cholecystectomy    . Laparoscopic appendectomy N/A 08/04/2015    Procedure: APPENDECTOMY LAPAROSCOPIC;  Surgeon: Florene Glen, MD;  Location: [facility] ORS;  Service: General;  Laterality: N/A;    Family History  Problem Relation Age of Onset  . Heart disease Father   . Brain cancer Father   . Breast cancer Sister     Social History   Social History  . Marital Status: Divorced    Spouse Name: N/A  . Number of Children: N/A  . Years of Education: N/A   Occupational History  . Not on file.   Social History Main Topics  . Smoking status: Former Smoker    Quit date: 04/03/2015  . Smokeless tobacco: Not on file  . Alcohol Use: No  . Drug Use: No  . Sexual Activity: Yes    Birth Control/ Protection: None   Other Topics Concern  . Not on file   Social History Narrative    Current Outpatient Prescriptions on File Prior to Visit  Medication Sig  Dispense Refill  . HYDROcodone-acetaminophen (NORCO/VICODIN) 5-325 MG tablet Take 1-2 tablets by mouth every 4 (four) hours as needed for moderate pain. 30 tablet 0  . Prenatal Vit-Fe Fumarate-FA (PRENATAL MULTIVITAMIN) TABS tablet Take 1 tablet by mouth daily at 12 noon.     No current facility-administered medications on file prior to visit.    Allergies  Allergen Reactions  . Penicillins Anaphylaxis      OBJECTIVE: Initial Physical Exam (New OB)  GENERAL APPEARANCE: alert, well appearing, in no apparent distress HEAD: normocephalic, atraumatic MOUTH: mucous membranes moist, pharynx normal without lesions THYROID: no thyromegaly or masses present BREASTS: no masses noted, no significant tenderness, no palpable axillary nodes, no skin changes LUNGS: clear to auscultation, no wheezes, rales or rhonchi, symmetric air entry HEART: regular rate and rhythm, no murmurs ABDOMEN: fundus soft, nontender 17 weeks size, FHT present (145)and skin scar: laparoscopic port sites healing well EXTREMITIES: no redness or tenderness in the calves or thighs SKIN: normal coloration and turgor, no rashes LYMPH NODES: no adenopathy palpable NEUROLOGIC: alert, oriented, normal speech, no focal findings or movement disorder noted  PELVIC EXAM EXTERNAL GENITALIA: normal appearing vulva with no masses, tenderness or lesions VAGINA: no abnormal discharge or lesions CERVIX: no lesions or cervical motion tenderness UTERUS: gravid ADNEXA:  no masses palpable and nontender OB EXAM PELVIMETRY: appears adequate RECTUM: exam not indicated  ASSESSMENT: Normal pregnancy Advanced maternal age 48 week s/p laparoscopic appendectomy  PLAN: Patient cannot recall if all prenatal labs drawn from [facility].  Will order NOB labs.  Will get records from [facility]. Prenatal vitamins encouraged Discussed genetic testing, desires Panorama.  Performed today.  To f/u with General Surgery this Friday for  post-op check.  New OB counseling: The patient has been given an overview regarding routine prenatal care.  Recommendations regarding diet, weight gain, and exercise in pregnancy were given.  Prenatal testing and ultrasound use in pregnancy were reviewed.  Benefits of Breast Feeding were discussed. The patient is encouraged to consider nursing her baby post partum. RTC in 3-4 weeks.  For anatomy scan at that time.    Rubie Maid, MD [facility]

## 2015-08-13 LAB — CBC WITH DIFFERENTIAL/PLATELET
Basophils Absolute: 0 10*3/uL (ref 0.0–0.2)
Basos: 0 %
EOS (ABSOLUTE): 0.1 10*3/uL (ref 0.0–0.4)
EOS: 1 %
HEMATOCRIT: 36 % (ref 34.0–46.6)
HEMOGLOBIN: 12.1 g/dL (ref 11.1–15.9)
IMMATURE GRANS (ABS): 0 10*3/uL (ref 0.0–0.1)
Immature Granulocytes: 0 %
LYMPHS: 19 %
Lymphocytes Absolute: 1.5 10*3/uL (ref 0.7–3.1)
MCH: 30.4 pg (ref 26.6–33.0)
MCHC: 33.6 g/dL (ref 31.5–35.7)
MCV: 91 fL (ref 79–97)
MONOCYTES: 4 %
MONOS ABS: 0.3 10*3/uL (ref 0.1–0.9)
NEUTROS PCT: 76 %
Neutrophils Absolute: 6.1 10*3/uL (ref 1.4–7.0)
Platelets: 258 10*3/uL (ref 150–379)
RBC: 3.98 x10E6/uL (ref 3.77–5.28)
RDW: 13.6 % (ref 12.3–15.4)
WBC: 8.1 10*3/uL (ref 3.4–10.8)

## 2015-08-13 LAB — HEP, RPR, HIV PANEL
HIV SCREEN 4TH GENERATION: NONREACTIVE
Hepatitis B Surface Ag: NEGATIVE
RPR: NONREACTIVE

## 2015-08-13 LAB — ANTIBODY SCREEN: Antibody Screen: NEGATIVE

## 2015-08-13 LAB — THYROID PANEL WITH TSH
Free Thyroxine Index: 2.3 (ref 1.2–4.9)
T3 UPTAKE RATIO: 16 % — AB (ref 24–39)
T4, Total: 14.6 ug/dL — ABNORMAL HIGH (ref 4.5–12.0)
TSH: 4.58 u[IU]/mL — ABNORMAL HIGH (ref 0.450–4.500)

## 2015-08-13 LAB — ABO AND RH: Rh Factor: POSITIVE

## 2015-08-13 LAB — HEMOGLOBIN A1C
Est. average glucose Bld gHb Est-mCnc: 100 mg/dL
HEMOGLOBIN A1C: 5.1 % (ref 4.8–5.6)

## 2015-08-13 LAB — RUBELLA SCREEN: RUBELLA: 4.07 {index} (ref >0.99)

## 2015-08-13 LAB — VARICELLA ZOSTER ANTIBODY, IGG: Varicella zoster IgG: 436 index (ref >165)

## 2015-08-14 ENCOUNTER — Encounter: Payer: Self-pay | Admitting: Surgery

## 2015-08-14 ENCOUNTER — Ambulatory Visit (INDEPENDENT_AMBULATORY_CARE_PROVIDER_SITE_OTHER): Payer: BLUE CROSS/BLUE SHIELD | Admitting: Surgery

## 2015-08-14 VITALS — BP 101/67 | HR 74 | Temp 97.9°F | Ht 62.0 in | Wt 173.0 lb

## 2015-08-14 DIAGNOSIS — K353 Acute appendicitis with localized peritonitis, without perforation or gangrene: Secondary | ICD-10-CM

## 2015-08-14 NOTE — Patient Instructions (Signed)
Please give us a call if you have any questions or concerns. 

## 2015-08-14 NOTE — Progress Notes (Signed)
Outpatient postop visit  08/14/2015  Katherine Eaton is an 39 y.o. female.    Procedure: Lap or scopic appendectomy  CC: Minimal pain improving  HPI: Patient doing well she has minimal pain in the right side where the incision is a larger incision being in the right upper quadrant due to her pregnancy she is approximately [redacted] weeks pregnant. She is eating well.  Medications reviewed.    Physical Exam:  BP 101/67 mmHg  Pulse 74  Temp(Src) 97.9 F (36.6 C) (Oral)  Ht 5\' 2"  (1.575 m)  Wt 173 lb (78.472 kg)  BMI 31.63 kg/m2  LMP 04/14/2015    PE: Afebrile soft nontender abdomen wounds are clean no erythema no drainage. Nontender calves.    Assessment/Plan:  Pathology is reviewed. Patient doing well recommend follow up on an as-needed basis.  Florene Glen, MD, FACS

## 2015-08-17 ENCOUNTER — Telehealth: Payer: Self-pay

## 2015-08-17 NOTE — Telephone Encounter (Signed)
Dr. Marcelline Mates agreed. However, if patient's job permitted to be seen before, to call the office and reschedule. Patient stated that she would rather wait to see Dr. Marcelline Mates. I did mention that if she felt bad for any reason to call the office. Patient understood. I also notified Dr. Marcelline Mates that patient wasn't able to change her appointment.

## 2015-08-17 NOTE — Telephone Encounter (Signed)
Called patient to let her know about your recommendations. Patient agreed. Just for you to know. When I called patient, she told me that her job wouldn't let her take September 08, 2015 for her follow-up appointment with you. She stated that if she took days off before 09/21/2015, that she was going to get fired.She asked me to help her and reschedule her appointment with you. So I called your office and they told me that you would be off the week of 12/19-25/2016. So, her appointment with you will be until 09/29/2015 at 8:30AM. Patient agreed. However, I told her that if she felt bad for any reason to call your office. She understood.       From: Rubie Maid, MD    Sent: 08/14/2015   6:46 PM      To: Wayna Chalet, CMA Subject: RE: Question                                   Roxanne Mins!  Nothing much she can take for vertigo, however staying hydrated and being cautious when changing positions should help.  For constipation she can take Colace 100 mg BID (is OTC).       ----- Message -----    From: Wayna Chalet, CMA    Sent: 08/14/2015   9:31 AM      To: Rubie Maid, MD Subject: Question                                       Patient was seen at our office this morning. She is doing well surgically but she mentioned that she has been having vertigo and constipation. I told her that I would send you a message to ask what she is able to take. Please let me know what she is able to take since I speak Spanish and I could deliver the message.  Wayna Chalet, CMA

## 2015-08-21 ENCOUNTER — Telehealth: Payer: Self-pay | Admitting: Obstetrics and Gynecology

## 2015-08-21 NOTE — Telephone Encounter (Signed)
PT CALLED AND SHE WAS HERE A FEW WEEKS AGO AND HAD SOME BLOOD WORK DONE AND HER FRIEND CALLED IN FOR HER DUE TO PT NOT SPEAKING ENGLISH AND PT WAS WANTING TO KNOW THE RESULTS, SO YOU PROBABLY WILL HAVE TO CALL LANGUAGE LINE FOR A 3 WAY CALL TO GIVE RESULTS

## 2015-08-21 NOTE — Telephone Encounter (Signed)
Pt is calling for results of panorama which have not come in yet. Will use language line to call pt when results are in.

## 2015-08-24 ENCOUNTER — Telehealth: Payer: Self-pay

## 2015-08-24 NOTE — Telephone Encounter (Signed)
Patient called in and asked for you. I had difficulty understanding what exactly it was that she needed. Please call.

## 2015-08-25 ENCOUNTER — Encounter: Payer: Self-pay | Admitting: Obstetrics and Gynecology

## 2015-08-25 NOTE — Telephone Encounter (Signed)
Called patient back. Patient wanted to know if there was any way that I could call her OB/GYN and reschedule her appointment with Dr. Marcelline Mates. I told her that I could do her the favor but if she needed to reschedule again, she would need to call their office. Patient understood. I called patient back and spoke with Jesus (boyfriend) and told him that patient's new appointment would be on 09/15/2015 at 10:15 AM. He stated that he would tell patient.

## 2015-08-26 ENCOUNTER — Telehealth: Payer: Self-pay

## 2015-08-26 NOTE — Telephone Encounter (Signed)
-----   Message from Rubie Maid, MD sent at 08/25/2015  6:53 PM EST ----- Please inform patient of normal Panorama results, female fetus if patient desires to know sex.

## 2015-08-26 NOTE — Telephone Encounter (Signed)
Called interpreter informed pt of negative results and female sex.

## 2015-09-08 ENCOUNTER — Ambulatory Visit: Payer: BLUE CROSS/BLUE SHIELD | Admitting: Obstetrics and Gynecology

## 2015-09-15 ENCOUNTER — Encounter: Payer: Self-pay | Admitting: Obstetrics and Gynecology

## 2015-09-15 ENCOUNTER — Ambulatory Visit (INDEPENDENT_AMBULATORY_CARE_PROVIDER_SITE_OTHER): Payer: BLUE CROSS/BLUE SHIELD | Admitting: Obstetrics and Gynecology

## 2015-09-15 ENCOUNTER — Ambulatory Visit (INDEPENDENT_AMBULATORY_CARE_PROVIDER_SITE_OTHER): Payer: BLUE CROSS/BLUE SHIELD

## 2015-09-15 ENCOUNTER — Telehealth: Payer: Self-pay | Admitting: Obstetrics and Gynecology

## 2015-09-15 VITALS — BP 101/63 | HR 88 | Wt 183.7 lb

## 2015-09-15 DIAGNOSIS — O09512 Supervision of elderly primigravida, second trimester: Secondary | ICD-10-CM

## 2015-09-15 DIAGNOSIS — O09519 Supervision of elderly primigravida, unspecified trimester: Secondary | ICD-10-CM | POA: Insufficient documentation

## 2015-09-15 DIAGNOSIS — R946 Abnormal results of thyroid function studies: Secondary | ICD-10-CM

## 2015-09-15 DIAGNOSIS — Z3492 Encounter for supervision of normal pregnancy, unspecified, second trimester: Secondary | ICD-10-CM

## 2015-09-15 DIAGNOSIS — O09522 Supervision of elderly multigravida, second trimester: Secondary | ICD-10-CM

## 2015-09-15 DIAGNOSIS — Z3402 Encounter for supervision of normal first pregnancy, second trimester: Secondary | ICD-10-CM

## 2015-09-15 DIAGNOSIS — R7989 Other specified abnormal findings of blood chemistry: Secondary | ICD-10-CM | POA: Insufficient documentation

## 2015-09-15 DIAGNOSIS — Z3482 Encounter for supervision of other normal pregnancy, second trimester: Secondary | ICD-10-CM

## 2015-09-15 LAB — POCT URINALYSIS DIPSTICK
BILIRUBIN UA: NEGATIVE
GLUCOSE UA: NEGATIVE
Ketones, UA: NEGATIVE
NITRITE UA: NEGATIVE
Protein, UA: NEGATIVE
RBC UA: NEGATIVE
Spec Grav, UA: 1.01
UROBILINOGEN UA: NEGATIVE
pH, UA: 7.5

## 2015-09-15 MED ORDER — PRENATAL VITAMINS 0.8 MG PO TABS: 1.0000 | ORAL_TABLET | Freq: Every day | ORAL | Status: DC

## 2015-09-15 NOTE — Telephone Encounter (Signed)
RX sent in

## 2015-09-15 NOTE — Telephone Encounter (Signed)
Patient would like a scripts called in for prenatal vitamins. She uses the [facility] on [address].Thanks

## 2015-09-15 NOTE — Progress Notes (Signed)
ROB: Patient doing well.  Missed several appointments due to work schedule.  Is s/p anatomy scan today with normal anatomy, however still need heart views.  RTC in 2 weeks for repeat scan, 4 weeks for OB appointment. Patient with abnormal thyroid studies 1st trimester (elevated TSH, with normal T4, possibly subclinical hypothyroidism).  Will repeat today.

## 2015-09-16 LAB — THYROID PANEL WITH TSH
Free Thyroxine Index: <1.9 (ref 1.2–4.9)
T4 TOTAL: 12.5 ug/dL — AB (ref 4.5–12.0)
TSH: 3.75 u[IU]/mL (ref 0.450–4.500)

## 2015-09-22 ENCOUNTER — Telehealth: Payer: Self-pay

## 2015-09-22 NOTE — Telephone Encounter (Signed)
Used pacific interpreters to call pt as she needs to be informed that her insurance terminates 10/03/2015, no answer, unable to leave message as no voicemail is set up.

## 2015-09-29 ENCOUNTER — Ambulatory Visit: Payer: BLUE CROSS/BLUE SHIELD | Admitting: Obstetrics and Gynecology

## 2015-09-30 ENCOUNTER — Other Ambulatory Visit: Payer: BLUE CROSS/BLUE SHIELD

## 2015-09-30 ENCOUNTER — Encounter: Payer: BLUE CROSS/BLUE SHIELD | Admitting: Obstetrics and Gynecology

## 2015-10-07 ENCOUNTER — Ambulatory Visit (INDEPENDENT_AMBULATORY_CARE_PROVIDER_SITE_OTHER): Payer: BLUE CROSS/BLUE SHIELD | Admitting: Obstetrics and Gynecology

## 2015-10-07 ENCOUNTER — Ambulatory Visit (INDEPENDENT_AMBULATORY_CARE_PROVIDER_SITE_OTHER): Payer: BLUE CROSS/BLUE SHIELD

## 2015-10-07 VITALS — BP 122/69 | HR 86 | Wt 187.3 lb

## 2015-10-07 DIAGNOSIS — Z3492 Encounter for supervision of normal pregnancy, unspecified, second trimester: Secondary | ICD-10-CM

## 2015-10-07 DIAGNOSIS — R7989 Other specified abnormal findings of blood chemistry: Secondary | ICD-10-CM

## 2015-10-07 DIAGNOSIS — R002 Palpitations: Secondary | ICD-10-CM

## 2015-10-07 DIAGNOSIS — R946 Abnormal results of thyroid function studies: Secondary | ICD-10-CM

## 2015-10-07 DIAGNOSIS — Z3482 Encounter for supervision of other normal pregnancy, second trimester: Secondary | ICD-10-CM

## 2015-10-07 LAB — POCT URINALYSIS DIPSTICK
BILIRUBIN UA: NEGATIVE
GLUCOSE UA: NEGATIVE
KETONES UA: NEGATIVE
Nitrite, UA: NEGATIVE
PH UA: 7
Protein, UA: NEGATIVE
Spec Grav, UA: 1.015
Urobilinogen, UA: NEGATIVE

## 2015-10-08 NOTE — Progress Notes (Signed)
ROB: Patient with complaints of occasional heart palpitations.  Thyroid studies reviewed, acceptable values in pregnancy. Normal rate today. Will order EKG.  Normal f/u anatomy scan.  RTC in 4 weeks.  For 1 hr glucola at that time.

## 2015-10-14 ENCOUNTER — Other Ambulatory Visit: Payer: Self-pay | Admitting: Family Medicine

## 2015-10-14 DIAGNOSIS — Z3401 Encounter for supervision of normal first pregnancy, first trimester: Secondary | ICD-10-CM

## 2015-10-14 DIAGNOSIS — Z3491 Encounter for supervision of normal pregnancy, unspecified, first trimester: Secondary | ICD-10-CM

## 2015-11-04 ENCOUNTER — Encounter: Payer: BLUE CROSS/BLUE SHIELD | Admitting: Obstetrics and Gynecology

## 2015-11-15 ENCOUNTER — Observation Stay
Admission: EM | Admit: 2015-11-15 | Discharge: 2015-11-15 | Disposition: A | Discharge status: Home / Self Care | Payer: BLUE CROSS/BLUE SHIELD | Attending: Obstetrics and Gynecology | Admitting: Obstetrics and Gynecology

## 2015-11-15 DIAGNOSIS — Z880 Allergy status to penicillin: Secondary | ICD-10-CM | POA: Diagnosis not present

## 2015-11-15 DIAGNOSIS — O26893 Other specified pregnancy related conditions, third trimester: Principal | ICD-10-CM | POA: Insufficient documentation

## 2015-11-15 DIAGNOSIS — O26899 Other specified pregnancy related conditions, unspecified trimester: Secondary | ICD-10-CM

## 2015-11-15 DIAGNOSIS — O09513 Supervision of elderly primigravida, third trimester: Secondary | ICD-10-CM | POA: Insufficient documentation

## 2015-11-15 DIAGNOSIS — O9989 Other specified diseases and conditions complicating pregnancy, childbirth and the puerperium: Secondary | ICD-10-CM | POA: Diagnosis not present

## 2015-11-15 DIAGNOSIS — Z3A30 30 weeks gestation of pregnancy: Secondary | ICD-10-CM | POA: Insufficient documentation

## 2015-11-15 DIAGNOSIS — R109 Unspecified abdominal pain: Secondary | ICD-10-CM | POA: Diagnosis not present

## 2015-11-15 LAB — URINALYSIS COMPLETE WITH MICROSCOPIC (ARMC ONLY)
BACTERIA UA: NONE SEEN
BILIRUBIN URINE: NEGATIVE
GLUCOSE, UA: NEGATIVE mg/dL
HGB URINE DIPSTICK: NEGATIVE
KETONES UR: NEGATIVE mg/dL
LEUKOCYTES UA: NEGATIVE
NITRITE: NEGATIVE
Protein, ur: NEGATIVE mg/dL
SPECIFIC GRAVITY, URINE: 1.016 (ref 1.005–1.030)
pH: 7 (ref 5.0–8.0)

## 2015-11-16 NOTE — Final Progress Note (Signed)
L&D OB Triage Note  SUBJECTIVE Katherine Eaton is a 40 y.o. G1P0 female at [redacted]w[redacted]d, EDD Estimated Date of Delivery: 01/19/16 who presented to triage with complaints of Abdominal Pain.    OBJECTIVE Nursing Evaluation: BP 109/67 mmHg  Pulse 89  Resp 18  Ht 5\' 2"  (1.575 m)  Wt 180 lb (81.647 kg)  BMI 32.91 kg/m2  LMP 04/14/2015 no significant findings for PTL. NST was performed and has been reviewed by me.  NST INTERPRETATION: Indications: Abdominal Pain/R/O labor  Mode: External Baseline Rate (A): 140 bpm Variability: Moderate Accelerations: 10 x 10 Decelerations: None     Contraction Frequency (min): rare  ASSESSMENT Impression:  1. Pregnancy:  G1P0 at [redacted]w[redacted]d , EDD Estimated Date of Delivery: 01/19/16 2.  Reactive NST 3. Abdominal pain, non acute  PLAN 1. Reassurance given 2. Discharge home with bleeding/labor precautions.  3. Continue routine prenatal care.   Brayton Mars, MD

## 2015-11-17 LAB — URINE CULTURE: Special Requests: NORMAL

## 2016-02-11 ENCOUNTER — Emergency Department
Admission: EM | Admit: 2016-02-11 | Discharge: 2016-02-11 | Disposition: A | Discharge status: Home / Self Care | Payer: BLUE CROSS/BLUE SHIELD | Attending: Emergency Medicine | Admitting: Emergency Medicine

## 2016-02-11 ENCOUNTER — Encounter: Payer: Self-pay | Admitting: Emergency Medicine

## 2016-02-11 ENCOUNTER — Emergency Department: Payer: BLUE CROSS/BLUE SHIELD

## 2016-02-11 DIAGNOSIS — Z79899 Other long term (current) drug therapy: Secondary | ICD-10-CM | POA: Diagnosis not present

## 2016-02-11 DIAGNOSIS — G510 Bell's palsy: Secondary | ICD-10-CM

## 2016-02-11 DIAGNOSIS — R51 Headache: Secondary | ICD-10-CM | POA: Insufficient documentation

## 2016-02-11 DIAGNOSIS — G08 Intracranial and intraspinal phlebitis and thrombophlebitis: Secondary | ICD-10-CM

## 2016-02-11 DIAGNOSIS — R29810 Facial weakness: Secondary | ICD-10-CM | POA: Diagnosis present

## 2016-02-11 LAB — COMPREHENSIVE METABOLIC PANEL
ALK PHOS: 101 U/L (ref 38–126)
ALT: 20 U/L (ref 14–54)
ANION GAP: 8 (ref 5–15)
AST: 20 U/L (ref 15–41)
Albumin: 3.7 g/dL (ref 3.5–5.0)
BUN: 9 mg/dL (ref 6–20)
CALCIUM: 9.2 mg/dL (ref 8.9–10.3)
CO2: 25 mmol/L (ref 22–32)
CREATININE: 0.47 mg/dL (ref 0.44–1.00)
Chloride: 106 mmol/L (ref 101–111)
GLUCOSE: 82 mg/dL (ref 65–99)
Potassium: 3.6 mmol/L (ref 3.5–5.1)
SODIUM: 139 mmol/L (ref 135–145)
Total Bilirubin: 0.5 mg/dL (ref 0.3–1.2)
Total Protein: 7.5 g/dL (ref 6.5–8.1)

## 2016-02-11 LAB — URINALYSIS COMPLETE WITH MICROSCOPIC (ARMC ONLY)
BILIRUBIN URINE: NEGATIVE
Bacteria, UA: NONE SEEN
GLUCOSE, UA: NEGATIVE mg/dL
Ketones, ur: NEGATIVE mg/dL
NITRITE: NEGATIVE
Protein, ur: NEGATIVE mg/dL
SPECIFIC GRAVITY, URINE: 1.006 (ref 1.005–1.030)
pH: 6 (ref 5.0–8.0)

## 2016-02-11 LAB — CBC WITH DIFFERENTIAL/PLATELET
Basophils Absolute: 0 10*3/uL (ref 0–0.1)
Basophils Relative: 1 %
EOS ABS: 0.1 10*3/uL (ref 0–0.7)
EOS PCT: 2 %
HCT: 39 % (ref 35.0–47.0)
HEMOGLOBIN: 13.4 g/dL (ref 12.0–16.0)
LYMPHS ABS: 1.9 10*3/uL (ref 1.0–3.6)
LYMPHS PCT: 38 %
MCH: 30.7 pg (ref 26.0–34.0)
MCHC: 34.3 g/dL (ref 32.0–36.0)
MCV: 89.5 fL (ref 80.0–100.0)
MONOS PCT: 5 %
Monocytes Absolute: 0.2 10*3/uL (ref 0.2–0.9)
Neutro Abs: 2.8 10*3/uL (ref 1.4–6.5)
Neutrophils Relative %: 54 %
PLATELETS: 276 10*3/uL (ref 150–440)
RBC: 4.35 MIL/uL (ref 3.80–5.20)
RDW: 13.7 % (ref 11.5–14.5)
WBC: 5 10*3/uL (ref 3.6–11.0)

## 2016-02-11 LAB — POCT PREGNANCY, URINE: Preg Test, Ur: NEGATIVE

## 2016-02-11 MED ORDER — IOPAMIDOL (ISOVUE-370) INJECTION 76%
75.0000 mL | Freq: Once | INTRAVENOUS | Status: AC | PRN
  Administered 2016-02-11: 75 mL via INTRAVENOUS

## 2016-02-11 MED ORDER — PREDNISONE 20 MG PO TABS: 40.0000 mg | ORAL_TABLET | Freq: Every day | ORAL | Status: DC

## 2016-02-11 MED ORDER — IOPAMIDOL (ISOVUE-300) INJECTION 61%: 75.0000 mL | Freq: Once | INTRAVENOUS | Status: DC | PRN

## 2016-02-11 NOTE — ED Notes (Signed)
MD and interpreter at the bedside talking to patient updating her on her care.

## 2016-02-11 NOTE — ED Notes (Signed)
Patient transported to CT 

## 2016-02-11 NOTE — Discharge Instructions (Signed)
Parlisis facial (Bell Palsy) La parlisis facial es una afeccin en la que se paralizan los msculos de un lado de la cara. Esto a menudo provoca la cada de un lado de la cara. Es una afeccin frecuente y [place] se recuperan por completo. [place] riesgo de la parlisis facial incluyen:  Media planner.  Diabetes.  Una infeccin provocada por un virus, como las infecciones que causan llagas peribucales. CAUSAS  La parlisis facial es ocasionada por un dao o una inflamacin de un nervio de la cara. No est claro por qu sucede, pero puede ocurrir a causa de una infeccin provocada por un virus. La mayora de las veces se desconoce la causa. [place] SNTOMAS  Los sntomas pueden variar de leves a graves y pueden durar varias horas. Entre los sntomas se pueden incluir los siguientes:  No poder Optometrist lo siguiente:  [place] cejas.  [place] ojos.  Sentir partes de la cara (entumecimiento facial).  Cada del prpado y la comisura de la boca.  Debilidad en la cara.  Parlisis de la mitad de la cara.  Prdida del gusto.  Sensibilidad a los ruidos fuertes.  Dificultad para Engineer, manufacturing systems.  Lagrimeo del ojo afectado.  Sequedad del ojo afectado.  Babeo.  Dolor detrs de [employer]. DIAGNSTICO  El diagnstico de la parlisis facial puede incluir:  Un examen fsico y Ardelia Mems historia clnica.  Una resonancia magntica.  Una tomografa computarizada.  Electromiograma (EMG). Esta prueba se realiza para controlar el funcionamiento de los nervios. TRATAMIENTO  El tratamiento puede incluir medicamentos antivirales para ayudar a reducir la duracin de Engineer, manufacturing systems. En ocasiones, el tratamiento no es necesario y los sntomas desaparecen por s solos. [place] los medicamentos solamente como se lo haya indicado el mdico.  Hgase masajes y realice los ejercicios faciales, como  se lo haya indicado el mdico.  Si el ojo est afectado:  Use gotas oftlmicas con efecto hidratante para prevenir la sequedad del ojo, como se lo haya indicado el mdico.  Proteja el ojo como se lo haya indicado el mdico. SOLICITE ATENCIN MDICA SI:  Los sntomas no mejoran o empeoran.  Babe[place].  El ojo est rojo, irritado o le duele. SOLICITE ATENCIN MDICA DE INMEDIATO SI:   Siente otra parte del cuerpo dbil o adormecida.  Tiene dificultad para tragar.  Tiene fiebre adems de los sntomas de la parlisis facial.  Siente dolor en el cuello. ASEGRESE DE QUE:   Comprende estas instrucciones.  Controlar su afeccin.  Recibir ayuda de inmediato si no mejora o si empeora.   Esta informacin no tiene Marine scientist el consejo del mdico. Asegrese de hacerle al mdico cualquier pregunta que tenga.   Document Released: 09/19/2005 Document Revised: 06/10/2015 Elsevier Interactive Patient Education [employer].  Please return immediately if condition worsens. Please contact her primary physician or the physician you were given for referral. If you have any specialist physicians involved in her treatment and plan please also contact them. Thank you for using [place] [facility]. Please get some eye moistening drops. You can also follow up with a neurologist.  Skin some eye moistening drops.

## 2016-02-11 NOTE — ED Notes (Signed)
Pt had a baby on April 3, has had headache since delivery, was diagnosed with preeclampsia in her last few weeks before delivery, then states her pressure went back to normal. VSS. Hospital Interpretor used.

## 2016-02-11 NOTE — ED Notes (Signed)
Dr Edd Fabian to triage to assess pt, determine plan of action, interpretor present.

## 2016-02-11 NOTE — ED Notes (Signed)
Pt with right side facial pain and drooping on right side started yesterday, when asked pt unable to close right eye but able to close left eye when asked. Pt states she feels like she has been hit with something on the right side of her face.

## 2016-02-12 NOTE — ED Provider Notes (Signed)
Time Seen: Approximately 1315 I have reviewed the triage notes  Chief Complaint: Facial Pain; Facial Droop; and Extremity Weakness   History of Present Illness: Katherine Eaton is a 40 y.o. female who presents with gradual onset of some right-sided facial weakness. The patient states her symptoms started yesterday morning and seemed to get progressively worse where she had trouble closing her right eye. She describes some occasional numbness in her left upper extremity but no other weakness. She denies any neck pain or fever. She denies any tick bite or exposure to tick borne disease. She is recently postpartum without complication. She is currently breast-feeding. She states that loud noises to ambulate her right ureter and food tastes "" better "" to her. She denies any significant headache. Patient's history, review of systems, and discharge instructions were performed with interpreter present   Past Medical History  Diagnosis Date  . Hypotension     Patient Active Problem List   Diagnosis Date Noted  . Abdominal pain affecting pregnancy, antepartum 11/15/2015  . Advanced maternal age, 1st pregnancy 09/15/2015  . Abnormal thyroid screen (blood) 09/15/2015  . Acute appendicitis complicating pregnancy     Past Surgical History  Procedure Laterality Date  . Cholecystectomy    . Laparoscopic appendectomy N/A 08/04/2015    Procedure: APPENDECTOMY LAPAROSCOPIC;  Surgeon: Florene Glen, MD;  Location: [facility] ORS;  Service: General;  Laterality: N/A;    Past Surgical History  Procedure Laterality Date  . Cholecystectomy    . Laparoscopic appendectomy N/A 08/04/2015    Procedure: APPENDECTOMY LAPAROSCOPIC;  Surgeon: Florene Glen, MD;  Location: [facility] ORS;  Service: General;  Laterality: N/A;    Current Outpatient Rx  Name  Route  Sig  Dispense  Refill  . folic acid (FOLVITE) [id number] MCG tablet   Oral   Take 400 mcg by mouth daily. Reported on 11/15/2015         .  HYDROcodone-acetaminophen (NORCO/VICODIN) 5-325 MG tablet   Oral   Take 1-2 tablets by mouth every 4 (four) hours as needed for moderate pain. Patient not taking: Reported on 11/15/2015   30 tablet   0   . predniSONE (DELTASONE) 20 MG tablet   Oral   Take 2 tablets (40 mg total) by mouth daily.   10 tablet   0   . Prenatal Multivit-Min-Fe-FA (PRENATAL VITAMINS) 0.8 MG tablet   Oral   Take 1 tablet by mouth daily. Patient not taking: Reported on 11/15/2015   30 tablet   12   . Prenatal Vit-Fe Fumarate-FA (PRENATAL MULTIVITAMIN) TABS tablet   Oral   Take 1 tablet by mouth daily at 12 noon.           Allergies:  Penicillins  Family History: Family History  Problem Relation Age of Onset  . Heart disease Father   . Brain cancer Father   . Breast cancer Sister     Social History: Social History  Substance Use Topics  . Smoking status: Never Smoker   . Smokeless tobacco: None  . Alcohol Use: No     Review of Systems:   10 point review of systems was performed and was otherwise negative:  Constitutional: No fever Eyes: No visual disturbances ENT: No sore throat, ear pain Cardiac: No chest pain Respiratory: No shortness of breath, wheezing, or stridor Abdomen: No abdominal pain, no vomiting, No diarrhea Endocrine: No weight loss, No night sweats Extremities: No peripheral edema, cyanosis Skin: No rashes, easy bruising Neurologic:  Weakness is only on the right side of the face. No trouble with speech or swallowing. Urologic: No dysuria, Hematuria, or urinary frequency   Physical Exam:  ED Triage Vitals  Enc Vitals Group     BP 02/11/16 1240 122/71 mmHg     Pulse Rate 02/11/16 1240 73     Resp 02/11/16 1240 18     Temp 02/11/16 1240 98 F (36.7 C)     Temp Source 02/11/16 1240 Oral     SpO2 02/11/16 1240 98 %     Weight 02/11/16 1240 202 lb 13.2 oz (92 kg)     Height 02/11/16 1240 5\' 4"  (1.626 m)     Head Cir --      Peak Flow --      Pain Score  02/11/16 1242 8     Pain Loc --      Pain Edu? --      Excl. in [place]? --     General: Awake , Alert , and Oriented times 3; GCS 15 Head: Normal cephalic , atraumaticPatient has blanching of the creases in her forehead on the right Eyes: Pupils equal , round, reactive to light. Extraocular eye movements are intact  Nose/Throat: No nasal drainage, patent upper airway without erythema or exudate.  Neck: Supple, Full range of motion, No anterior adenopathy or palpable thyroid masses. No bruits Lungs: Clear to ascultation without wheezes , rhonchi, or rales Heart: Regular rate, regular rhythm without murmurs , gallops , or rubs Abdomen: Soft, non tender without rebound, guarding , or rigidity; bowel sounds positive and symmetric in all 4 quadrants. No organomegaly .        Extremities: 2 plus symmetric pulses. No edema, clubbing or cyanosis Neurologic: Right-sided facial nerve palsy. She has difficulty closing her right eye and a right-sided facial weakness. Patient otherwise has normal strength in both upper and lower extremities and symmetric Skin: warm, dry, no rashes   Labs:   All laboratory work was reviewed including any pertinent negatives or positives listed below:  [place] - Abnormal; Notable for the following:    Color, Urine STRAW (*)    APPearance CLEAR (*)    Hgb urine dipstick 2+ (*)    Leukocytes, UA TRACE (*)    Squamous Epithelial / LPF 0-5 (*)    All other components within normal limits  CBC WITH DIFFERENTIAL/PLATELET  COMPREHENSIVE METABOLIC PANEL  POC URINE PREG, ED  POCT PREGNANCY, URINE   Radiology:     CLINICAL DATA: Right facial pain. Right facial droop. Rule out cavernous sinus thrombosis.  EXAM: CT VENOGRAM HEAD  TECHNIQUE: Venous phase scanning of the brain was obtained with sagittal and coronal reconstruction. Precontrast imaging also performed.  CONTRAST: 75 mL Isovue 370  IV  COMPARISON: None.  FINDINGS: Ventricle size normal. Cerebral volume normal.  Negative for acute or chronic infarction. Negative for intracranial hemorrhage. Negative for mass or edema  Calvarium is intact. Skull base normal. Visualized paranasal sinuses normal.  Postcontrast imaging demonstrates symmetric enhancement of the cavernous sinus bilaterally. No mass lesion or sinus thrombosis. Superior ophthalmic vein is symmetric and normal in size bilaterally. No orbital mass or edema.  Hypoplastic right transverse sinus. Left transverse sinus is normal and patent. Superior sagittal sinus is patent.  Postcontrast imaging reveals normal enhancement. No enhancing mass lesion.  IMPRESSION: No acute abnormality. No evidence of cavernous sinus thrombosis or mass lesion.  Hypoplastic right transverse sinus. Left transverse sinus and  superior sagittal sinus patent.   Electronically Signed By: Franchot Gallo M.D. On: 02/11/2016 15:29  I personally reviewed the radiologic studies    ED Course:  Patient's stay here was uneventful and her head CT was performed to make sure the patient did not have a cavernous venous thrombosis or cerebrovascular accident associated with her being recently postpartum. Her clinical presentation is classic Bell's palsy. She does not appear to have any new lesions or fever to  indicate a significant viral illness. The patient will be treated on an outpatient basis with oral prednisone. I felt that she did not require antiviral medications at this time,   Assessment: * Right-sided Bell's palsy   Final Clinical Impression:  Final diagnoses:  Bell palsy     Plan:  Outpatient management Patient was advised to return immediately if condition worsens. Patient was advised to follow up with their primary care physician or other specialized physicians involved in their outpatient care. The patient and/or family member/power of attorney had  laboratory results reviewed at the bedside. All questions and concerns were addressed and appropriate discharge instructions were distributed by the nursing staff. Patient was given instructions for Bell's palsy in Spanish           Daymon Larsen, MD 02/12/16 1358

## 2016-11-29 ENCOUNTER — Emergency Department: Payer: 59

## 2016-11-29 ENCOUNTER — Encounter: Payer: Self-pay | Admitting: *Deleted

## 2016-11-29 ENCOUNTER — Emergency Department
Admission: EM | Admit: 2016-11-29 | Discharge: 2016-11-29 | Disposition: A | Discharge status: Home / Self Care | Payer: 59 | Attending: Emergency Medicine | Admitting: Emergency Medicine

## 2016-11-29 DIAGNOSIS — R519 Headache, unspecified: Secondary | ICD-10-CM

## 2016-11-29 DIAGNOSIS — R079 Chest pain, unspecified: Secondary | ICD-10-CM | POA: Insufficient documentation

## 2016-11-29 DIAGNOSIS — G8929 Other chronic pain: Secondary | ICD-10-CM | POA: Insufficient documentation

## 2016-11-29 DIAGNOSIS — R51 Headache: Secondary | ICD-10-CM | POA: Insufficient documentation

## 2016-11-29 DIAGNOSIS — R42 Dizziness and giddiness: Secondary | ICD-10-CM | POA: Insufficient documentation

## 2016-11-29 LAB — BASIC METABOLIC PANEL
ANION GAP: 5 (ref 5–15)
BUN: 9 mg/dL (ref 6–20)
CHLORIDE: 107 mmol/L (ref 101–111)
CO2: 25 mmol/L (ref 22–32)
CREATININE: 0.55 mg/dL (ref 0.44–1.00)
Calcium: 8.5 mg/dL — ABNORMAL LOW (ref 8.9–10.3)
GFR calc non Af Amer: >60 mL/min (ref >60)
Glucose, Bld: 98 mg/dL (ref 65–99)
Potassium: 3.9 mmol/L (ref 3.5–5.1)
Sodium: 137 mmol/L (ref 135–145)

## 2016-11-29 LAB — CBC
HCT: 36.5 % (ref 35.0–47.0)
HEMOGLOBIN: 12.4 g/dL (ref 12.0–16.0)
MCH: 29.6 pg (ref 26.0–34.0)
MCHC: 34 g/dL (ref 32.0–36.0)
MCV: 87.1 fL (ref 80.0–100.0)
Platelets: 299 10*3/uL (ref 150–440)
RBC: 4.19 MIL/uL (ref 3.80–5.20)
RDW: 13.9 % (ref 11.5–14.5)
WBC: 11.2 10*3/uL — ABNORMAL HIGH (ref 3.6–11.0)

## 2016-11-29 LAB — TROPONIN I
Troponin I: <0.03 ng/mL (ref <0.03)
Troponin I: <0.03 ng/mL (ref <0.03)

## 2016-11-29 MED ORDER — LORAZEPAM 2 MG/ML IJ SOLN
1.0000 mg | Freq: Once | INTRAMUSCULAR | Status: AC
  Administered 2016-11-29: 1 mg via INTRAVENOUS
  Filled 2016-11-29: qty 1

## 2016-11-29 MED ORDER — MECLIZINE HCL 25 MG PO TABS: 25.0000 mg | ORAL_TABLET | Freq: Three times a day (TID) | ORAL | 0 refills | Status: DC | PRN

## 2016-11-29 MED ORDER — KETOROLAC TROMETHAMINE 30 MG/ML IJ SOLN
30.0000 mg | Freq: Once | INTRAMUSCULAR | Status: AC
  Administered 2016-11-29: 30 mg via INTRAVENOUS
  Filled 2016-11-29: qty 1

## 2016-11-29 MED ORDER — BUTALBITAL-APAP-CAFFEINE 50-325-40 MG PO TABS: 1.0000 | ORAL_TABLET | Freq: Four times a day (QID) | ORAL | 0 refills | Status: AC | PRN

## 2016-11-29 MED ORDER — SODIUM CHLORIDE 0.9 % IV BOLUS (SEPSIS)
1000.0000 mL | Freq: Once | INTRAVENOUS | Status: AC
  Administered 2016-11-29: 1000 mL via INTRAVENOUS

## 2016-11-29 MED ORDER — METOCLOPRAMIDE HCL 5 MG/ML IJ SOLN
10.0000 mg | Freq: Once | INTRAMUSCULAR | Status: AC
  Administered 2016-11-29: 10 mg via INTRAVENOUS
  Filled 2016-11-29: qty 2

## 2016-11-29 MED ORDER — DIPHENHYDRAMINE HCL 50 MG/ML IJ SOLN
25.0000 mg | Freq: Once | INTRAMUSCULAR | Status: AC
  Administered 2016-11-29: 25 mg via INTRAVENOUS
  Filled 2016-11-29: qty 1

## 2016-11-29 MED ORDER — MECLIZINE HCL 25 MG PO TABS
25.0000 mg | ORAL_TABLET | Freq: Once | ORAL | Status: AC
  Administered 2016-11-29: 25 mg via ORAL
  Filled 2016-11-29: qty 1

## 2016-11-29 NOTE — ED Provider Notes (Signed)
[facility] Emergency Department Provider Note   ____________________________________________   First MD Initiated Contact with Patient 11/29/16 [phone]     (approximate)  I have reviewed the triage vital signs and the nursing notes.   HISTORY  Chief Complaint Chest Pain    HPI Katherine Eaton is a 41 y.o. female who comes into the hospital today with dizziness and chest pain. She reports started at 4 AM yesterday. She reports that she went to work but after 6 PM she couldn't drive. She's been unable to eat or drink much on the days that she works. She reports that she is at work from 6 AM to 5 PM. She reports that she was feeling dizzy. She still having some dizzy and feels like everything is spinning. She had something similar happened 5 years ago but was told it was due to an infection in her body. She does not what medication she took the help the symptoms. She reports her chest pain as a 3-4 out of 10 in intensity currently. She denies any problems breathing. Last week she had some chest pain is radiation to her right arm and again it came back yesterday. The patient denies any fever or abdominal pain. She has had a headache which she reports as been constant for the past month and a half. She takes Tylenol and it helps ago with a little bit but then it never goes fully away. The patient has not been to see her doctor for these symptoms but came in tonight because she couldn't deal with the pain in the symptoms.   Past Medical History:  Diagnosis Date  . Hypotension     Patient Active Problem List   Diagnosis Date Noted  . Abdominal pain affecting pregnancy, antepartum 11/15/2015  . Advanced maternal age, 1st pregnancy 09/15/2015  . Abnormal thyroid screen (blood) 09/15/2015  . Acute appendicitis complicating pregnancy     Past Surgical History:  Procedure Laterality Date  . CHOLECYSTECTOMY    . LAPAROSCOPIC APPENDECTOMY N/A 08/04/2015     Procedure: APPENDECTOMY LAPAROSCOPIC;  Surgeon: Florene Glen, MD;  Location: [facility] ORS;  Service: General;  Laterality: N/A;    Prior to Admission medications   Medication Sig Start Date End Date Taking? Authorizing Provider  butalbital-acetaminophen-caffeine (FIORICET, ESGIC) [phone] MG tablet Take 1-2 tablets by mouth every 6 (six) hours as needed for headache. 11/29/16 11/29/17  Loney Hering, MD  HYDROcodone-acetaminophen (NORCO/VICODIN) 5-325 MG tablet Take 1-2 tablets by mouth every 4 (four) hours as needed for moderate pain. Patient not taking: Reported on 11/15/2015 08/05/15   Florene Glen, MD  meclizine (ANTIVERT) 25 MG tablet Take 1 tablet (25 mg total) by mouth 3 (three) times daily as needed for dizziness. 11/29/16   Loney Hering, MD  predniSONE (DELTASONE) 20 MG tablet Take 2 tablets (40 mg total) by mouth daily. Patient not taking: Reported on 11/29/2016 02/11/16   Daymon Larsen, MD  Prenatal Multivit-Min-Fe-FA (PRENATAL VITAMINS) 0.8 MG tablet Take 1 tablet by mouth daily. Patient not taking: Reported on 11/15/2015 09/15/15   Rubie Maid, MD    Allergies Penicillins  Family History  Problem Relation Age of Onset  . Heart disease Father   . Brain cancer Father   . Breast cancer Sister     Social History Social History  Substance Use Topics  . Smoking status: Never Smoker  . Smokeless tobacco: Never Used  . Alcohol use No    Review of  Systems Constitutional: No fever/chills Eyes: No visual changes. ENT: No sore throat. Cardiovascular:  chest pain. Respiratory: Denies shortness of breath. Gastrointestinal: No abdominal pain.  No nausea, no vomiting.  No diarrhea.  No constipation. Genitourinary: Negative for dysuria. Musculoskeletal: Negative for back pain. Skin: Negative for rash. Neurological: Dizziness and headache  10-point ROS otherwise negative.  ____________________________________________   PHYSICAL EXAM:  VITAL SIGNS: ED Triage  Vitals  Enc Vitals Group     BP 11/29/16 0024 118/76     Pulse Rate 11/29/16 0024 64     Resp 11/29/16 0024 18     Temp 11/29/16 0024 98.2 F (36.8 C)     Temp Source 11/29/16 0024 Oral     SpO2 11/29/16 0024 99 %     Weight 11/29/16 0025 160 lb (72.6 kg)     Height 11/29/16 0025 5\' 2"  (1.575 m)     Head Circumference --      Peak Flow --      Pain Score 11/29/16 0037 8     Pain Loc --      Pain Edu? --      Excl. in [place]? --     Constitutional: Alert and oriented. Well appearing and in moderate distress. Eyes: Conjunctivae are normal. PERRL. EOMI. Head: Atraumatic. Nose: No congestion/rhinnorhea. Mouth/Throat: Mucous membranes are moist.  Oropharynx non-erythematous. Cardiovascular: Normal rate, regular rhythm. Grossly normal heart sounds.  Good peripheral circulation. Respiratory: Normal respiratory effort.  No retractions. Lungs CTAB. Gastrointestinal: Soft and nontender. No distention. Positive bowel sounds Musculoskeletal: No lower extremity tenderness nor edema.   Neurologic:  Normal speech and language. Cranial nerves II through XII are grossly intact with no focal motor or neuro deficits Skin:  Skin is warm, dry and intact.  Psychiatric: Mood and affect are normal.   ____________________________________________   LABS (all labs ordered are listed, but only abnormal results are displayed)  Labs Reviewed  BASIC METABOLIC PANEL - Abnormal; Notable for the following:       Result Value   Calcium 8.5 (*)    All other components within normal limits  CBC - Abnormal; Notable for the following:    WBC 11.2 (*)    All other components within normal limits  TROPONIN I  TROPONIN I   ____________________________________________  EKG  ED ECG REPORT I, Loney Hering, the attending physician, personally viewed and interpreted this ECG.   Date: 11/29/2016  EKG Time: 0027  Rate: 60  Rhythm: normal sinus rhythm  Axis: normal  Intervals:none  ST&T Change:  none  ____________________________________________  RADIOLOGY  CT head CXR ____________________________________________   PROCEDURES  Procedure(s) performed: None  Procedures  Critical Care performed: No  ____________________________________________   INITIAL IMPRESSION / ASSESSMENT AND PLAN / ED COURSE  Pertinent labs & imaging results that were available during my care of the patient were reviewed by me and considered in my medical decision making (see chart for details).  This is a 41 year old female who comes into the hospital today with dizziness and chest pain. The patient had 2 sets of enzymes that were done that were negative. I did give the patient some Reglan, Benadryl and a liter of normal saline and she reports she does not eat and drink much during the day. This may be contributing to her dizziness. Also give the patient a dose of meclizine. She reports that she still had some dizziness I gave her some Ativan. The patient does need to improve her diet as well  as her hydration status. At this time her blood work and her imaging studies are all negative. The patient needs to follow-up with her primary care physician. This may be the cause of her headaches which she has. I will discharge the patient to home to have her follow-up with her primary care physician. I will give her prescription for Fioricet and meclizine. She should follow-up with her doctor.  Clinical Course as of Nov 29 740  Tue Nov 29, 2016  0455 Normal CT HEAD. CT Head Wo Contrast [AW]  [id number] Normal chest. DG Chest 2 View [AW]    Clinical Course User Index [AW] Loney Hering, MD     ____________________________________________   FINAL CLINICAL IMPRESSION(S) / ED DIAGNOSES  Final diagnoses:  Dizziness  Chest pain, unspecified type  Chronic nonintractable headache, unspecified headache type      NEW MEDICATIONS STARTED DURING THIS VISIT:  New Prescriptions    BUTALBITAL-ACETAMINOPHEN-CAFFEINE (FIORICET, ESGIC) 50-325-40 MG TABLET    Take 1-2 tablets by mouth every 6 (six) hours as needed for headache.   MECLIZINE (ANTIVERT) 25 MG TABLET    Take 1 tablet (25 mg total) by mouth 3 (three) times daily as needed for dizziness.     Note:  This document was prepared using Dragon voice recognition software and may include unintentional dictation errors.    Loney Hering, MD 11/29/16 [id number]

## 2016-11-29 NOTE — ED Triage Notes (Signed)
Pt to triage via wheelchair.  Pt reports chest pain radiating into right arm.  Pt also reports dizziness and headache today.  No n/v/d.  Pt alert.

## 2016-11-29 NOTE — Discharge Instructions (Signed)
Por favor sigue con su doctor de cabezera para evaluar la razon porque tiene [employer]

## 2017-05-24 ENCOUNTER — Emergency Department
Admission: EM | Admit: 2017-05-24 | Discharge: 2017-05-24 | Disposition: A | Discharge status: Home / Self Care | Payer: PRIVATE HEALTH INSURANCE | Attending: Emergency Medicine | Admitting: Emergency Medicine

## 2017-05-24 ENCOUNTER — Emergency Department: Payer: PRIVATE HEALTH INSURANCE

## 2017-05-24 DIAGNOSIS — R29810 Facial weakness: Secondary | ICD-10-CM | POA: Insufficient documentation

## 2017-05-24 DIAGNOSIS — H8149 Vertigo of central origin, unspecified ear: Secondary | ICD-10-CM | POA: Diagnosis not present

## 2017-05-24 DIAGNOSIS — I959 Hypotension, unspecified: Secondary | ICD-10-CM | POA: Insufficient documentation

## 2017-05-24 DIAGNOSIS — R1110 Vomiting, unspecified: Secondary | ICD-10-CM | POA: Diagnosis not present

## 2017-05-24 DIAGNOSIS — R51 Headache: Secondary | ICD-10-CM | POA: Diagnosis present

## 2017-05-24 DIAGNOSIS — R42 Dizziness and giddiness: Secondary | ICD-10-CM

## 2017-05-24 LAB — CBC
HEMATOCRIT: 40.1 % (ref 35.0–47.0)
HEMOGLOBIN: 13.7 g/dL (ref 12.0–16.0)
MCH: 30.2 pg (ref 26.0–34.0)
MCHC: 34.1 g/dL (ref 32.0–36.0)
MCV: 88.7 fL (ref 80.0–100.0)
Platelets: 241 10*3/uL (ref 150–440)
RBC: 4.52 MIL/uL (ref 3.80–5.20)
RDW: 13.5 % (ref 11.5–14.5)
WBC: 7.2 10*3/uL (ref 3.6–11.0)

## 2017-05-24 LAB — COMPREHENSIVE METABOLIC PANEL
ALK PHOS: 79 U/L (ref 38–126)
ALT: 28 U/L (ref 14–54)
AST: 15 U/L (ref 15–41)
Albumin: 4 g/dL (ref 3.5–5.0)
Anion gap: 5 (ref 5–15)
BILIRUBIN TOTAL: 0.8 mg/dL (ref 0.3–1.2)
BUN: 7 mg/dL (ref 6–20)
CALCIUM: 8.8 mg/dL — AB (ref 8.9–10.3)
CO2: 25 mmol/L (ref 22–32)
CREATININE: 0.49 mg/dL (ref 0.44–1.00)
Chloride: 108 mmol/L (ref 101–111)
Glucose, Bld: 92 mg/dL (ref 65–99)
Potassium: 4.2 mmol/L (ref 3.5–5.1)
SODIUM: 138 mmol/L (ref 135–145)
TOTAL PROTEIN: 7.4 g/dL (ref 6.5–8.1)

## 2017-05-24 LAB — TROPONIN I: Troponin I: <0.03 ng/mL (ref <0.03)

## 2017-05-24 LAB — PROTIME-INR
INR: 0.95
Prothrombin Time: 12.7 seconds (ref 11.4–15.2)

## 2017-05-24 LAB — DIFFERENTIAL
Basophils Absolute: 0.1 10*3/uL (ref 0–0.1)
Basophils Relative: 1 %
Eosinophils Absolute: 0.1 10*3/uL (ref 0–0.7)
Eosinophils Relative: 1 %
LYMPHS ABS: 2.1 10*3/uL (ref 1.0–3.6)
LYMPHS PCT: 28 %
MONO ABS: 0.3 10*3/uL (ref 0.2–0.9)
MONOS PCT: 4 %
NEUTROS ABS: 4.8 10*3/uL (ref 1.4–6.5)
Neutrophils Relative %: 66 %

## 2017-05-24 LAB — APTT: aPTT: 29 seconds (ref 24–36)

## 2017-05-24 MED ORDER — MECLIZINE HCL 25 MG PO TABS
50.0000 mg | ORAL_TABLET | Freq: Once | ORAL | Status: AC
  Administered 2017-05-24: 50 mg via ORAL
  Filled 2017-05-24: qty 2

## 2017-05-24 MED ORDER — SODIUM CHLORIDE 0.9 % IV SOLN
Freq: Once | INTRAVENOUS | Status: AC
  Administered 2017-05-24: 14:00:00 via INTRAVENOUS

## 2017-05-24 MED ORDER — MECLIZINE HCL 25 MG PO TABS: 25.0000 mg | ORAL_TABLET | Freq: Three times a day (TID) | ORAL | 1 refills | Status: DC | PRN

## 2017-05-24 MED ORDER — METOCLOPRAMIDE HCL 5 MG/ML IJ SOLN
10.0000 mg | Freq: Once | INTRAMUSCULAR | Status: AC
  Administered 2017-05-24: 10 mg via INTRAVENOUS
  Filled 2017-05-24: qty 2

## 2017-05-24 MED ORDER — DIAZEPAM 5 MG PO TABS
5.0000 mg | ORAL_TABLET | Freq: Once | ORAL | Status: AC
  Administered 2017-05-24: 5 mg via ORAL
  Filled 2017-05-24: qty 1

## 2017-05-24 MED ORDER — DIAZEPAM 5 MG PO TABS: 5.0000 mg | ORAL_TABLET | Freq: Three times a day (TID) | ORAL | 0 refills | Status: DC | PRN

## 2017-05-24 MED ORDER — ONDANSETRON HCL 4 MG/2ML IJ SOLN: 4.0000 mg | Freq: Once | INTRAMUSCULAR | Status: DC

## 2017-05-24 NOTE — ED Triage Notes (Signed)
Pt states thru interpreter that she began having pain in her head after an episode of "rage" on Saturday morning. She states that she has also had some n/v/d, which continues. Pt states that she has tingling on the right side of her face. Describes pain as intermittent, throbbing in nature; affirms photosensitivity "like a migraine." Pt denies hx of migraine, htn, dm, other medical issues. Pt alert & oriented with nad noted.

## 2017-05-24 NOTE — ED Provider Notes (Addendum)
[facility] Emergency Department Provider Note       Time seen: ----------------------------------------- 1:09 PM on 05/24/2017 -----------------------------------------     I have reviewed the triage vital signs and the nursing notes.   HISTORY   Chief Complaint Headache; Emesis; and Facial Droop    HPI Katherine Eaton is a 41 y.o. female who presents to the ED for headache after episode of dizziness. By dizziness she describes room spinning sensation. She states this happen once in the previous year in [place] where she was diagnosed with vertigo and this was thought to be stress-induced. She was given medication for but she is not sure where she was taking. She describes she has had increased stress recently and also may have had decreased oral intake because of her work. She was having tingling on the right side of her face that has resolved.   Past Medical History:  Diagnosis Date  . Hypotension     Patient Active Problem List   Diagnosis Date Noted  . Abdominal pain affecting pregnancy, antepartum 11/15/2015  . Advanced maternal age, 1st pregnancy 09/15/2015  . Abnormal thyroid screen (blood) 09/15/2015  . Acute appendicitis complicating pregnancy     Past Surgical History:  Procedure Laterality Date  . CHOLECYSTECTOMY    . LAPAROSCOPIC APPENDECTOMY N/A 08/04/2015   Procedure: APPENDECTOMY LAPAROSCOPIC;  Surgeon: Florene Glen, MD;  Location: [facility] ORS;  Service: General;  Laterality: N/A;    Allergies Penicillins  Social History Social History  Substance Use Topics  . Smoking status: Never Smoker  . Smokeless tobacco: Never Used  . Alcohol use No    Review of Systems Constitutional: Negative for fever. Eyes: Negative for vision changes ENT:  Negative for congestion, sore throat Cardiovascular: Negative for chest pain. Respiratory: Negative for shortness of breath. Gastrointestinal: Negative for abdominal pain,  vomiting and diarrhea. Genitourinary: Negative for dysuria. Musculoskeletal: Negative for back pain. Skin: Negative for rash. Neurological: Positive for headache, vertigo  All systems negative/normal/unremarkable except as stated in the HPI  ____________________________________________   PHYSICAL EXAM:  VITAL SIGNS: ED Triage Vitals  Enc Vitals Group     BP 05/24/17 1215 101/71     Pulse Rate 05/24/17 1116 69     Resp 05/24/17 1215 14     Temp 05/24/17 1116 98.2 F (36.8 C)     Temp Source 05/24/17 1116 Oral     SpO2 05/24/17 1116 100 %     Weight 05/24/17 1116 183 lb (83 kg)     Height 05/24/17 1116 5' 3.78" (1.62 m)     Head Circumference --      Peak Flow --      Pain Score 05/24/17 1115 8     Pain Loc --      Pain Edu? --      Excl. in [place]? --     Constitutional: Alert and oriented. Well appearing and in no distress. Eyes: Conjunctivae are normal. Normal extraocular movements. ENT   Head: Normocephalic and atraumatic.   Nose: No congestion/rhinnorhea.   Mouth/Throat: Mucous membranes are moist.   Neck: No stridor. Cardiovascular: Normal rate, regular rhythm. No murmurs, rubs, or gallops. Respiratory: Normal respiratory effort without tachypnea nor retractions. Breath sounds are clear and equal bilaterally. No wheezes/rales/rhonchi. Gastrointestinal: Soft and nontender. Normal bowel sounds Musculoskeletal: Nontender with normal range of motion in extremities. No lower extremity tenderness nor edema. Neurologic:  Normal speech and language. No gross focal neurologic deficits are appreciated.  Skin:  Skin is warm, dry and intact. No rash noted. Psychiatric: Mood and affect are normal. Speech and behavior are normal.  ____________________________________________  EKG: Interpreted by me.Sinus rhythm with a rate of 64 bpm, normal PR interval, normal QRS, normal QT.  ____________________________________________  ED COURSE:  Pertinent labs & imaging  results that were available during my care of the patient were reviewed by me and considered in my medical decision making (see chart for details). Patient presents for symptoms of vertigo and possible headache, we will assess with labs and imaging as indicated. I have attempted the Epley maneuver with mild success   Procedures ____________________________________________   LABS (pertinent positives/negatives)  Labs Reviewed  COMPREHENSIVE METABOLIC PANEL - Abnormal; Notable for the following:       Result Value   Calcium 8.8 (*)    All other components within normal limits  PROTIME-INR  APTT  CBC  DIFFERENTIAL  TROPONIN I  CBG MONITORING, ED    RADIOLOGY  CT head is unremarkable  ____________________________________________  FINAL ASSESSMENT AND PLAN  Vertigo  Plan: Patient's labs and imaging were dictated above. Patient had presented for symptoms of headache and vertigo. Currently her symptoms are improved, she'll be discharged with meclizine and Valium and is stable for outpatient follow-up.   Earleen Newport, MD   Note: This note was generated in part or whole with voice recognition software. Voice recognition is usually quite accurate but there are transcription errors that can and very often do occur. I apologize for any typographical errors that were not detected and corrected.     Earleen Newport, MD 05/24/17 1424    Earleen Newport, MD 05/24/17 1425

## 2018-04-03 ENCOUNTER — Encounter: Payer: Self-pay | Admitting: Emergency Medicine

## 2018-04-03 ENCOUNTER — Emergency Department
Admission: EM | Admit: 2018-04-03 | Discharge: 2018-04-03 | Disposition: A | Discharge status: Home / Self Care | Payer: 59 | Attending: Emergency Medicine | Admitting: Emergency Medicine

## 2018-04-03 ENCOUNTER — Emergency Department: Payer: 59

## 2018-04-03 ENCOUNTER — Other Ambulatory Visit: Payer: Self-pay

## 2018-04-03 DIAGNOSIS — Y9389 Activity, other specified: Secondary | ICD-10-CM | POA: Insufficient documentation

## 2018-04-03 DIAGNOSIS — S39012A Strain of muscle, fascia and tendon of lower back, initial encounter: Secondary | ICD-10-CM

## 2018-04-03 DIAGNOSIS — Y92411 Interstate highway as the place of occurrence of the external cause: Secondary | ICD-10-CM | POA: Diagnosis not present

## 2018-04-03 DIAGNOSIS — R51 Headache: Secondary | ICD-10-CM | POA: Diagnosis not present

## 2018-04-03 DIAGNOSIS — S161XXA Strain of muscle, fascia and tendon at neck level, initial encounter: Secondary | ICD-10-CM | POA: Diagnosis not present

## 2018-04-03 DIAGNOSIS — Z79899 Other long term (current) drug therapy: Secondary | ICD-10-CM | POA: Insufficient documentation

## 2018-04-03 DIAGNOSIS — Y998 Other external cause status: Secondary | ICD-10-CM | POA: Diagnosis not present

## 2018-04-03 DIAGNOSIS — S199XXA Unspecified injury of neck, initial encounter: Secondary | ICD-10-CM | POA: Diagnosis present

## 2018-04-03 LAB — URINALYSIS, COMPLETE (UACMP) WITH MICROSCOPIC
BACTERIA UA: NONE SEEN
BILIRUBIN URINE: NEGATIVE
Glucose, UA: NEGATIVE mg/dL
KETONES UR: NEGATIVE mg/dL
LEUKOCYTES UA: NEGATIVE
NITRITE: NEGATIVE
PROTEIN: NEGATIVE mg/dL
Specific Gravity, Urine: 1.017 (ref 1.005–1.030)
pH: 6 (ref 5.0–8.0)

## 2018-04-03 LAB — POCT PREGNANCY, URINE: Preg Test, Ur: NEGATIVE

## 2018-04-03 MED ORDER — ORPHENADRINE CITRATE 30 MG/ML IJ SOLN
60.0000 mg | Freq: Once | INTRAMUSCULAR | Status: AC
  Administered 2018-04-03: 60 mg via INTRAMUSCULAR
  Filled 2018-04-03: qty 2

## 2018-04-03 MED ORDER — MELOXICAM 15 MG PO TABS: 15.0000 mg | ORAL_TABLET | Freq: Every day | ORAL | 0 refills | Status: DC

## 2018-04-03 MED ORDER — KETOROLAC TROMETHAMINE 30 MG/ML IJ SOLN
30.0000 mg | Freq: Once | INTRAMUSCULAR | Status: AC
  Administered 2018-04-03: 30 mg via INTRAMUSCULAR
  Filled 2018-04-03: qty 1

## 2018-04-03 MED ORDER — METHOCARBAMOL 500 MG PO TABS: 500.0000 mg | ORAL_TABLET | Freq: Four times a day (QID) | ORAL | 0 refills | Status: DC
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
## 2018-04-03 NOTE — ED Notes (Signed)
Interpreter present at time of injections for instruction.
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
## 2019-06-12 ENCOUNTER — Ambulatory Visit (LOCAL_COMMUNITY_HEALTH_CENTER): Payer: Self-pay

## 2019-06-12 ENCOUNTER — Other Ambulatory Visit: Payer: Self-pay

## 2019-06-12 ENCOUNTER — Other Ambulatory Visit: Payer: Self-pay | Admitting: Family Medicine

## 2019-06-12 ENCOUNTER — Ambulatory Visit
Admission: RE | Admit: 2019-06-12 | Discharge: 2019-06-12 | Disposition: A | Discharge status: Home / Self Care | Payer: 59 | Source: Ambulatory Visit | Attending: Family Medicine | Admitting: Family Medicine

## 2019-06-12 DIAGNOSIS — R7612 Nonspecific reaction to cell mediated immunity measurement of gamma interferon antigen response without active tuberculosis: Secondary | ICD-10-CM | POA: Diagnosis present

## 2019-06-12 NOTE — Progress Notes (Signed)
Patient referred from [facility] for +[facility]FT. See flowsheet for sx's. Clinic supervisor notified also d/t reported sx's correlate with covid 17. Patient tested this afternoon for covid also. Patient reports she and husband were tested for covid 04/2019.  EPI completed via phone d/t sx's reported and sent for CXR (no weight collected at this time). Patient instructed to collect sputums; will drop off at [facility] tomorrow afternoon.  Patient reports weight loss of 42lbs, dizziness, bruising, cough, fever between 5-6pm, left sided abdominal pain, fatigue, insomnia and loss of appetite. Aileen Fass, RN

## 2019-06-13 ENCOUNTER — Other Ambulatory Visit (LOCAL_COMMUNITY_HEALTH_CENTER): Payer: Self-pay

## 2019-06-13 DIAGNOSIS — R7612 Nonspecific reaction to cell mediated immunity measurement of gamma interferon antigen response without active tuberculosis: Secondary | ICD-10-CM

## 2019-06-13 NOTE — Progress Notes (Signed)
Sputums x 3 dropped off by patient.  TB RN will f/u once AFB smears are back. Aileen Fass, RN

## 2019-06-17 LAB — HEPATIC FUNCTION PANEL
ALT: 36 IU/L — ABNORMAL HIGH (ref 0–32)
AST: 25 IU/L (ref 0–40)
Albumin: 4.7 g/dL (ref 3.8–4.8)
Alkaline Phosphatase: 115 IU/L (ref 39–117)
Bilirubin Total: 0.4 mg/dL (ref 0.0–1.2)
Bilirubin, Direct: 0.11 mg/dL (ref 0.00–0.40)
Total Protein: 7.4 g/dL (ref 6.0–8.5)

## 2019-06-17 LAB — CBC WITH DIFFERENTIAL/PLATELET
Basophils Absolute: 0 10*3/uL (ref 0.0–0.2)
Basos: 0 %
EOS (ABSOLUTE): 0.1 10*3/uL (ref 0.0–0.4)
Eos: 1 %
Hematocrit: 38 % (ref 34.0–46.6)
Hemoglobin: 12.6 g/dL (ref 11.1–15.9)
Immature Grans (Abs): 0 10*3/uL (ref 0.0–0.1)
Immature Granulocytes: 0 %
Lymphocytes Absolute: 1.8 10*3/uL (ref 0.7–3.1)
Lymphs: 18 %
MCH: 29.4 pg (ref 26.6–33.0)
MCHC: 33.2 g/dL (ref 31.5–35.7)
MCV: 89 fL (ref 79–97)
Monocytes Absolute: 0.4 10*3/uL (ref 0.1–0.9)
Monocytes: 5 %
Neutrophils Absolute: 7.5 10*3/uL — ABNORMAL HIGH (ref 1.4–7.0)
Neutrophils: 76 %
Platelets: 363 10*3/uL (ref 150–450)
RBC: 4.28 x10E6/uL (ref 3.77–5.28)
RDW: 14 % (ref 11.7–15.4)
WBC: 9.8 10*3/uL (ref 3.4–10.8)

## 2019-06-17 LAB — QUANTIFERON-TB GOLD PLUS
QuantiFERON Mitogen Value: 0.5 IU/mL
QuantiFERON Nil Value: 0.07 IU/mL
QuantiFERON TB1 Ag Value: 5.98 IU/mL
QuantiFERON TB2 Ag Value: 4.14 IU/mL
QuantiFERON-TB Gold Plus: POSITIVE — AB

## 2019-06-17 LAB — CREATININE, SERUM
Creatinine, Ser: 0.44 mg/dL — ABNORMAL LOW (ref 0.57–1.00)
GFR calc Af Amer: 145 mL/min/{1.73_m2} (ref >59)
GFR calc non Af Amer: 126 mL/min/{1.73_m2} (ref >59)

## 2019-06-17 LAB — RPR

## 2019-06-17 LAB — HIV ANTIBODY (ROUTINE TESTING W REFLEX)

## 2019-06-18 ENCOUNTER — Telehealth: Payer: Self-pay
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
## 2019-07-11 NOTE — Addendum Note (Signed)
Addended by: Aileen Fass on: 07/11/2019 03:20 PM   Modules accepted: Orders

## 2019-08-05 ENCOUNTER — Telehealth: Payer: Self-pay

## 2019-08-05 NOTE — Telephone Encounter (Signed)
TC with patient re: sputum results.  Informed sputum cultures negative.  Pt still continues to have health concerns and last week had breast biopsy.  Discussed LTBI. If patient chooses to proceed with LTBI tx will call TB RN .Aileen Fass, RN

## 2020-08-01 ENCOUNTER — Emergency Department: Payer: PRIVATE HEALTH INSURANCE

## 2020-08-01 ENCOUNTER — Encounter: Payer: Self-pay | Admitting: Emergency Medicine

## 2020-08-01 ENCOUNTER — Other Ambulatory Visit: Payer: Self-pay

## 2020-08-01 ENCOUNTER — Emergency Department
Admission: EM | Admit: 2020-08-01 | Discharge: 2020-08-02 | Disposition: A | Discharge status: Home / Self Care | Payer: PRIVATE HEALTH INSURANCE | Attending: Emergency Medicine | Admitting: Emergency Medicine

## 2020-08-01 DIAGNOSIS — M25562 Pain in left knee: Secondary | ICD-10-CM | POA: Diagnosis not present

## 2020-08-01 DIAGNOSIS — M546 Pain in thoracic spine: Secondary | ICD-10-CM | POA: Insufficient documentation

## 2020-08-01 DIAGNOSIS — R1010 Upper abdominal pain, unspecified: Secondary | ICD-10-CM | POA: Insufficient documentation

## 2020-08-01 DIAGNOSIS — R519 Headache, unspecified: Secondary | ICD-10-CM | POA: Insufficient documentation

## 2020-08-01 DIAGNOSIS — R079 Chest pain, unspecified: Secondary | ICD-10-CM | POA: Diagnosis not present

## 2020-08-01 DIAGNOSIS — M542 Cervicalgia: Secondary | ICD-10-CM | POA: Diagnosis not present

## 2020-08-01 LAB — COMPREHENSIVE METABOLIC PANEL
ALT: 22 U/L (ref 0–44)
AST: 20 U/L (ref 15–41)
Albumin: 4 g/dL (ref 3.5–5.0)
Alkaline Phosphatase: 76 U/L (ref 38–126)
Anion gap: 8 (ref 5–15)
BUN: 15 mg/dL (ref 6–20)
CO2: 24 mmol/L (ref 22–32)
Calcium: 8.8 mg/dL — ABNORMAL LOW (ref 8.9–10.3)
Chloride: 107 mmol/L (ref 98–111)
Creatinine, Ser: 0.5 mg/dL (ref 0.44–1.00)
GFR, Estimated: >60 mL/min (ref >60)
Glucose, Bld: 100 mg/dL — ABNORMAL HIGH (ref 70–99)
Potassium: 3.9 mmol/L (ref 3.5–5.1)
Sodium: 139 mmol/L (ref 135–145)
Total Bilirubin: 0.5 mg/dL (ref 0.3–1.2)
Total Protein: 7.5 g/dL (ref 6.5–8.1)

## 2020-08-01 LAB — CBC WITH DIFFERENTIAL/PLATELET
Abs Immature Granulocytes: 0.04 10*3/uL (ref 0.00–0.07)
Basophils Absolute: 0.1 10*3/uL (ref 0.0–0.1)
Basophils Relative: 1 %
Eosinophils Absolute: 0.1 10*3/uL (ref 0.0–0.5)
Eosinophils Relative: 1 %
HCT: 40 % (ref 36.0–46.0)
Hemoglobin: 13.7 g/dL (ref 12.0–15.0)
Immature Granulocytes: 0 %
Lymphocytes Relative: 20 %
Lymphs Abs: 2.5 10*3/uL (ref 0.7–4.0)
MCH: 31 pg (ref 26.0–34.0)
MCHC: 34.3 g/dL (ref 30.0–36.0)
MCV: 90.5 fL (ref 80.0–100.0)
Monocytes Absolute: 0.5 10*3/uL (ref 0.1–1.0)
Monocytes Relative: 4 %
Neutro Abs: 9.3 10*3/uL — ABNORMAL HIGH (ref 1.7–7.7)
Neutrophils Relative %: 74 %
Platelets: 261 10*3/uL (ref 150–400)
RBC: 4.42 MIL/uL (ref 3.87–5.11)
RDW: 12.8 % (ref 11.5–15.5)
WBC: 12.5 10*3/uL — ABNORMAL HIGH (ref 4.0–10.5)
nRBC: 0 % (ref 0.0–0.2)

## 2020-08-01 MED ORDER — IOHEXOL 300 MG/ML  SOLN
100.0000 mL | Freq: Once | INTRAMUSCULAR | Status: AC | PRN
  Administered 2020-08-01: 100 mL via INTRAVENOUS
  Filled 2020-08-01: qty 100

## 2020-08-01 MED ORDER — KETOROLAC TROMETHAMINE 30 MG/ML IJ SOLN: 30.0000 mg | Freq: Once | INTRAMUSCULAR | Status: DC

## 2020-08-01 MED ORDER — MELOXICAM 15 MG PO TABS: 15.0000 mg | ORAL_TABLET | Freq: Every day | ORAL | 1 refills | Status: AC

## 2020-08-01 MED ORDER — ACETAMINOPHEN 500 MG PO TABS: 1000.0000 mg | ORAL_TABLET | Freq: Once | ORAL | Status: DC

## 2020-08-01 MED ORDER — METHOCARBAMOL 500 MG PO TABS: 500.0000 mg | ORAL_TABLET | Freq: Three times a day (TID) | ORAL | 0 refills | Status: AC | PRN
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
## 2020-11-05 ENCOUNTER — Other Ambulatory Visit: Payer: Self-pay

## 2020-11-05 DIAGNOSIS — Z20822 Contact with and (suspected) exposure to covid-19: Secondary | ICD-10-CM

## 2020-11-06 LAB — SARS-COV-2, NAA 2 DAY TAT

## 2020-11-06 LAB — NOVEL CORONAVIRUS, NAA: SARS-CoV-2, NAA: NOT DETECTED

## 2021-02-02 ENCOUNTER — Emergency Department: Payer: Medicaid Other

## 2021-02-02 ENCOUNTER — Emergency Department
Admission: EM | Admit: 2021-02-02 | Discharge: 2021-02-02 | Disposition: A | Discharge status: Home / Self Care | Payer: Medicaid Other | Attending: Emergency Medicine | Admitting: Emergency Medicine

## 2021-02-02 ENCOUNTER — Other Ambulatory Visit: Payer: Self-pay

## 2021-02-02 DIAGNOSIS — D259 Leiomyoma of uterus, unspecified: Secondary | ICD-10-CM | POA: Diagnosis not present

## 2021-02-02 DIAGNOSIS — R1032 Left lower quadrant pain: Secondary | ICD-10-CM | POA: Diagnosis present

## 2021-02-02 DIAGNOSIS — R1030 Lower abdominal pain, unspecified: Secondary | ICD-10-CM

## 2021-02-02 LAB — URINALYSIS, COMPLETE (UACMP) WITH MICROSCOPIC
Bilirubin Urine: NEGATIVE
Glucose, UA: NEGATIVE mg/dL
Ketones, ur: NEGATIVE mg/dL
Nitrite: NEGATIVE
Protein, ur: NEGATIVE mg/dL
Specific Gravity, Urine: 1.003 — ABNORMAL LOW (ref 1.005–1.030)
pH: 5 (ref 5.0–8.0)

## 2021-02-02 LAB — COMPREHENSIVE METABOLIC PANEL
ALT: 15 U/L (ref 0–44)
AST: 15 U/L (ref 15–41)
Albumin: 3.7 g/dL (ref 3.5–5.0)
Alkaline Phosphatase: 79 U/L (ref 38–126)
Anion gap: 7 (ref 5–15)
BUN: 8 mg/dL (ref 6–20)
CO2: 24 mmol/L (ref 22–32)
Calcium: 8.8 mg/dL — ABNORMAL LOW (ref 8.9–10.3)
Chloride: 105 mmol/L (ref 98–111)
Creatinine, Ser: 0.49 mg/dL (ref 0.44–1.00)
GFR, Estimated: >60 mL/min (ref >60)
Glucose, Bld: 88 mg/dL (ref 70–99)
Potassium: 4.3 mmol/L (ref 3.5–5.1)
Sodium: 136 mmol/L (ref 135–145)
Total Bilirubin: 0.8 mg/dL (ref 0.3–1.2)
Total Protein: 7.3 g/dL (ref 6.5–8.1)

## 2021-02-02 LAB — LIPASE, BLOOD: Lipase: 30 U/L (ref 11–51)

## 2021-02-02 LAB — CBC
HCT: 39.2 % (ref 36.0–46.0)
Hemoglobin: 13.1 g/dL (ref 12.0–15.0)
MCH: 30.3 pg (ref 26.0–34.0)
MCHC: 33.4 g/dL (ref 30.0–36.0)
MCV: 90.5 fL (ref 80.0–100.0)
Platelets: 274 10*3/uL (ref 150–400)
RBC: 4.33 MIL/uL (ref 3.87–5.11)
RDW: 12.9 % (ref 11.5–15.5)
WBC: 9.5 10*3/uL (ref 4.0–10.5)
nRBC: 0 % (ref 0.0–0.2)

## 2021-02-02 LAB — POC URINE PREG, ED: Preg Test, Ur: NEGATIVE

## 2021-02-02 MED ORDER — MORPHINE SULFATE (PF) 4 MG/ML IV SOLN
4.0000 mg | Freq: Once | INTRAVENOUS | Status: AC
  Administered 2021-02-02: 4 mg via INTRAVENOUS
  Filled 2021-02-02: qty 1

## 2021-02-02 MED ORDER — KETOROLAC TROMETHAMINE 30 MG/ML IJ SOLN
15.0000 mg | Freq: Once | INTRAMUSCULAR | Status: AC
  Administered 2021-02-02: 15 mg via INTRAVENOUS
  Filled 2021-02-02: qty 1

## 2021-02-02 MED ORDER — IBUPROFEN 600 MG PO TABS: 600.0000 mg | ORAL_TABLET | Freq: Three times a day (TID) | ORAL | 0 refills | Status: AC | PRN

## 2021-02-02 MED ORDER — HYDROCODONE-ACETAMINOPHEN 5-325 MG PO TABS
1.0000 | ORAL_TABLET | Freq: Four times a day (QID) | ORAL | 0 refills | Status: AC | PRN
Start: 2021-02-02 — End: 2022-02-02

## 2021-02-02 MED ORDER — ONDANSETRON HCL 4 MG/2ML IJ SOLN
4.0000 mg | Freq: Once | INTRAMUSCULAR | Status: AC
  Administered 2021-02-02: 4 mg via INTRAVENOUS
  Filled 2021-02-02: qty 2

## 2021-02-02 MED ORDER — IOHEXOL 300 MG/ML  SOLN
100.0000 mL | Freq: Once | INTRAMUSCULAR | Status: AC | PRN
  Administered 2021-02-02: 100 mL via INTRAVENOUS

## 2021-02-02 NOTE — ED Notes (Signed)
ED Provider at bedside. 

## 2021-02-02 NOTE — ED Notes (Signed)
Discharge instructions reviewed with pt and spouse. Pt calm , collective , denied pain or sob upon discharge .

## 2021-02-02 NOTE — ED Provider Notes (Signed)
[facility] Emergency Department Provider Note  ____________________________________________   Event Date/Time   First MD Initiated Contact with Patient 02/02/21 [phone]     (approximate)  I have reviewed the triage vital signs and the nursing notes.   HISTORY  Chief Complaint Abdominal Pain    HPI Katherine Eaton is a 45 y.o. female  Here with llq abd pain. Pt reports approx 1 month of aching, gnawing, left lower quadrant abdominal pain. Pain has been fairly constant though intermittently worse and tearing. Worse w/ movement, palpation, and coughing. No alleviating factors. No change with eating or with BMs. Has had some mild diarrhea with it. No fevers, chills. No known sick contacts. No recent ABX use. Denies any urinary sx, vaginal bleeding or vaginal discharge. No other complaints. Has not taken any OTC meds.        Past Medical History:  Diagnosis Date  . Hypotension     Patient Active Problem List   Diagnosis Date Noted  . Abdominal pain affecting pregnancy, antepartum 11/15/2015  . Advanced maternal age, 1st pregnancy 09/15/2015  . Abnormal thyroid screen (blood) 09/15/2015  . Acute appendicitis complicating pregnancy     Past Surgical History:  Procedure Laterality Date  . CHOLECYSTECTOMY    . LAPAROSCOPIC APPENDECTOMY N/A 08/04/2015   Procedure: APPENDECTOMY LAPAROSCOPIC;  Surgeon: Florene Glen, MD;  Location: [facility] ORS;  Service: General;  Laterality: N/A;    Prior to Admission medications   Medication Sig Start Date End Date Taking? Authorizing Provider  HYDROcodone-acetaminophen (NORCO/VICODIN) 5-325 MG tablet Take 1-2 tablets by mouth every 6 (six) hours as needed for severe pain (no more than 6 tabs per day). 02/02/21 02/02/22 Yes Duffy Bruce, MD  ibuprofen (ADVIL) 600 MG tablet Take 1 tablet (600 mg total) by mouth every 8 (eight) hours as needed for moderate pain. 02/02/21  Yes Duffy Bruce, MD  diazepam  (VALIUM) 5 MG tablet Take 1 tablet (5 mg total) by mouth every 8 (eight) hours as needed for muscle spasms. Patient not taking: Reported on 06/12/2019 05/24/17   Earleen Newport, MD  meclizine (ANTIVERT) 25 MG tablet Take 1 tablet (25 mg total) by mouth 3 (three) times daily as needed for dizziness. Patient not taking: Reported on 05/24/2017 11/29/16   Loney Hering, MD  meclizine (ANTIVERT) 25 MG tablet Take 1 tablet (25 mg total) by mouth 3 (three) times daily as needed for dizziness or nausea. Patient not taking: Reported on 06/12/2019 05/24/17   Earleen Newport, MD  predniSONE (DELTASONE) 20 MG tablet Take 2 tablets (40 mg total) by mouth daily. Patient not taking: Reported on 11/29/2016 02/11/16   Daymon Larsen, MD  Prenatal Multivit-Min-Fe-FA (PRENATAL VITAMINS) 0.8 MG tablet Take 1 tablet by mouth daily. Patient not taking: Reported on 11/15/2015 09/15/15   Rubie Maid, MD    Allergies Penicillins  Family History  Problem Relation Age of Onset  . Heart disease Father   . Brain cancer Father   . Breast cancer Sister     Social History Social History   Tobacco Use  . Smoking status: Never Smoker  . Smokeless tobacco: Never Used  Vaping Use  . Vaping Use: Never used  Substance Use Topics  . Alcohol use: No  . Drug use: No    Review of Systems  Review of Systems  Constitutional: Negative for chills and fever.  HENT: Negative for sore throat.   Respiratory: Negative for shortness of breath.   Cardiovascular:  Negative for chest pain.  Gastrointestinal: Positive for abdominal pain and diarrhea.  Genitourinary: Negative for flank pain.  Musculoskeletal: Negative for neck pain.  Skin: Negative for rash and wound.  Allergic/Immunologic: Negative for immunocompromised state.  Neurological: Negative for weakness and numbness.  Hematological: Does not bruise/bleed easily.     ____________________________________________  PHYSICAL EXAM:      VITAL SIGNS: ED  Triage Vitals  Enc Vitals Group     BP      Pulse      Resp      Temp      Temp src      SpO2      Weight      Height      Head Circumference      Peak Flow      Pain Score      Pain Loc      Pain Edu?      Excl. in [place]?      Physical Exam Vitals and nursing note reviewed.  Constitutional:      General: She is not in acute distress.    Appearance: She is well-developed.  HENT:     Head: Normocephalic and atraumatic.  Eyes:     Conjunctiva/sclera: Conjunctivae normal.  Cardiovascular:     Rate and Rhythm: Normal rate and regular rhythm.     Heart sounds: Normal heart sounds.  Pulmonary:     Effort: Pulmonary effort is normal. No respiratory distress.     Breath sounds: No wheezing.  Abdominal:     General: There is no distension.     Tenderness: There is abdominal tenderness in the left lower quadrant. There is no right CVA tenderness, left CVA tenderness, guarding or rebound.  Musculoskeletal:     Cervical back: Neck supple.  Skin:    General: Skin is warm.     Capillary Refill: Capillary refill takes less than 2 seconds.     Findings: No rash.  Neurological:     Mental Status: She is alert and oriented to person, place, and time.     Motor: No abnormal muscle tone.       ____________________________________________   LABS (all labs ordered are listed, but only abnormal results are displayed)  Labs Reviewed  COMPREHENSIVE METABOLIC PANEL - Abnormal; Notable for the following components:      Result Value   Calcium 8.8 (*)    All other components within normal limits  URINALYSIS, COMPLETE (UACMP) WITH MICROSCOPIC - Abnormal; Notable for the following components:   Color, Urine STRAW (*)    APPearance CLEAR (*)    Specific Gravity, Urine 1.003 (*)    Hgb urine dipstick SMALL (*)    Leukocytes,Ua TRACE (*)    Bacteria, UA RARE (*)    All other components within normal limits  LIPASE, BLOOD  CBC  POC URINE PREG, ED     ____________________________________________  EKG:  ________________________________________  RADIOLOGY All imaging, including plain films, CT scans, and ultrasounds, independently reviewed by me, and interpretations confirmed via formal radiology reads.  ED MD interpretation:   CT abdomen/pelvis: No acute findings, stable fibroids  Official radiology report(s): CT ABDOMEN PELVIS W CONTRAST  Result Date: 02/02/2021 CLINICAL DATA:  Left lower quadrant abdominal pain. Prior history of an appendectomy and cholecystectomy. EXAM: CT ABDOMEN AND PELVIS WITH CONTRAST TECHNIQUE: Multidetector CT imaging of the abdomen and pelvis was performed using the standard protocol following bolus administration of intravenous contrast. CONTRAST:  175mL OMNIPAQUE IOHEXOL 300 MG/ML  SOLN COMPARISON:  08/01/2020 FINDINGS: Lower chest: Clear lung bases.  Heart normal in size. Hepatobiliary: No focal liver abnormality is seen. Status post cholecystectomy. No biliary dilatation. Pancreas: Unremarkable. No pancreatic ductal dilatation or surrounding inflammatory changes. Spleen: Normal in size without focal abnormality. Adrenals/Urinary Tract: Adrenal glands are unremarkable. Kidneys are normal, without renal calculi, focal lesion, or hydronephrosis. Bladder is unremarkable. Stomach/Bowel: Normal stomach. Small bowel and colon are normal in caliber. No wall thickening. No inflammation. Vascular/Lymphatic: No significant vascular findings are present. No enlarged abdominal or pelvic lymph nodes. Reproductive: Stable uterine fibroids.  No adnexal masses. Other: No abdominal wall hernia or abnormality. No abdominopelvic ascites. Musculoskeletal: No acute or significant osseous findings. IMPRESSION: 1. No acute findings. No findings to account for left-sided abdominal pain. 2. Stable uterine fibroids. Stable changes from previous cholecystectomy and appendectomy. No other abnormalities. Electronically Signed   By: Lajean Manes M.D.   On: 02/02/2021 10:31    ____________________________________________  PROCEDURES   Procedure(s) performed (including Critical Care):  Procedures  ____________________________________________  INITIAL IMPRESSION / MDM / [place] / ED COURSE  As part of my medical decision making, I reviewed the following data within the [place] notes reviewed and incorporated, Old chart reviewed, Notes from prior ED visits, and [place] Controlled Substance Database       *Katherine Eaton was evaluated in Emergency Department on 02/02/2021 for the symptoms described in the history of present illness. She was evaluated in the context of the global COVID-19 pandemic, which necessitated consideration that the patient might be at risk for infection with the SARS-CoV-2 virus that causes COVID-19. Institutional protocols and algorithms that pertain to the evaluation of patients at risk for COVID-19 are in a state of rapid change based on information released by regulatory bodies including the CDC and federal and state organizations. These policies and algorithms were followed during the patient's care in the ED.  Some ED evaluations and interventions may be delayed as a result of limited staffing during the pandemic.*     Medical Decision Making: Well-appearing 45 year old female here with intermittent left lower abdominal pain.  On exam, she is overall nontoxic and well-appearing.  White count normal.  LFTs and renal function are normal.  UA without signs of UTI.  Denies any vaginal bleeding or discharge.  CT of the abdomen and pelvis obtained, reviewed, and shows no acute emergent pathology.  She does have uterine fibroids which could be explaining some of her discomfort.  Will place her on NSAIDs, brief course of analgesia, and refer to OB.  Otherwise, no apparent emergent pathology.  ____________________________________________  FINAL CLINICAL  IMPRESSION(S) / ED DIAGNOSES  Final diagnoses:  Uterine leiomyoma, unspecified location  Lower abdominal pain     MEDICATIONS GIVEN DURING THIS VISIT:  Medications  morphine 4 MG/ML injection 4 mg (4 mg Intravenous Given 02/02/21 0921)  ketorolac (TORADOL) 30 MG/ML injection 15 mg (15 mg Intravenous Given 02/02/21 0921)  ondansetron (ZOFRAN) injection 4 mg (4 mg Intravenous Given 02/02/21 0921)  iohexol (OMNIPAQUE) 300 MG/ML solution 100 mL (100 mLs Intravenous Contrast Given 02/02/21 0954)     ED Discharge Orders         Ordered    ibuprofen (ADVIL) 600 MG tablet  Every 8 hours PRN        02/02/21 1211    HYDROcodone-acetaminophen (NORCO/VICODIN) 5-325 MG tablet  Every 6 hours PRN        02/02/21 1211  Note:  This document was prepared using Dragon voice recognition software and may include unintentional dictation errors.   Duffy Bruce, MD 02/02/21 1330

## 2021-02-02 NOTE — ED Triage Notes (Signed)
Pt comes with c/o left sided abdominal pain the patient states pain is worse when she coughs or walks. Pt denies ay N/V/D/  Pt states this has been going on  For over a month.

## 2021-02-02 NOTE — ED Notes (Signed)
Husband at bedside.  

## 2021-05-25 ENCOUNTER — Other Ambulatory Visit: Payer: Self-pay | Admitting: Physician Assistant

## 2021-05-25 DIAGNOSIS — D259 Leiomyoma of uterus, unspecified: Secondary | ICD-10-CM

## 2021-05-26 ENCOUNTER — Other Ambulatory Visit: Payer: Self-pay | Admitting: Physician Assistant

## 2021-05-26 DIAGNOSIS — Z3201 Encounter for pregnancy test, result positive: Secondary | ICD-10-CM

## 2021-05-26 DIAGNOSIS — D259 Leiomyoma of uterus, unspecified: Secondary | ICD-10-CM

## 2021-12-15 ENCOUNTER — Other Ambulatory Visit: Payer: Self-pay | Admitting: Nurse Practitioner

## 2021-12-15 ENCOUNTER — Other Ambulatory Visit: Payer: Self-pay

## 2021-12-15 ENCOUNTER — Ambulatory Visit
Admission: RE | Admit: 2021-12-15 | Discharge: 2021-12-15 | Disposition: A | Discharge status: Home / Self Care | Payer: Medicaid Other | Attending: Nurse Practitioner | Admitting: Nurse Practitioner

## 2021-12-15 ENCOUNTER — Ambulatory Visit
Admission: RE | Admit: 2021-12-15 | Discharge: 2021-12-15 | Disposition: A | Discharge status: Home / Self Care | Payer: Medicaid Other | Source: Ambulatory Visit | Attending: Nurse Practitioner | Admitting: Nurse Practitioner

## 2021-12-15 DIAGNOSIS — R52 Pain, unspecified: Secondary | ICD-10-CM

## 2021-12-20 ENCOUNTER — Emergency Department
Admission: EM | Admit: 2021-12-20 | Discharge: 2021-12-20 | Disposition: A | Discharge status: Home / Self Care | Payer: Medicaid Other | Attending: Emergency Medicine | Admitting: Emergency Medicine

## 2021-12-20 ENCOUNTER — Encounter: Payer: Self-pay | Admitting: Emergency Medicine

## 2021-12-20 ENCOUNTER — Other Ambulatory Visit: Payer: Self-pay

## 2021-12-20 ENCOUNTER — Emergency Department: Payer: Medicaid Other

## 2021-12-20 DIAGNOSIS — R5383 Other fatigue: Secondary | ICD-10-CM | POA: Diagnosis not present

## 2021-12-20 DIAGNOSIS — R112 Nausea with vomiting, unspecified: Secondary | ICD-10-CM | POA: Diagnosis not present

## 2021-12-20 DIAGNOSIS — R42 Dizziness and giddiness: Secondary | ICD-10-CM | POA: Insufficient documentation

## 2021-12-20 LAB — BASIC METABOLIC PANEL
Anion gap: 8 (ref 5–15)
BUN: 8 mg/dL (ref 6–20)
CO2: 25 mmol/L (ref 22–32)
Calcium: 8.9 mg/dL (ref 8.9–10.3)
Chloride: 105 mmol/L (ref 98–111)
Creatinine, Ser: 0.48 mg/dL (ref 0.44–1.00)
GFR, Estimated: >60 mL/min (ref >60)
Glucose, Bld: 88 mg/dL (ref 70–99)
Potassium: 4 mmol/L (ref 3.5–5.1)
Sodium: 138 mmol/L (ref 135–145)

## 2021-12-20 LAB — CBC
HCT: 42.5 % (ref 36.0–46.0)
Hemoglobin: 13.9 g/dL (ref 12.0–15.0)
MCH: 29.6 pg (ref 26.0–34.0)
MCHC: 32.7 g/dL (ref 30.0–36.0)
MCV: 90.4 fL (ref 80.0–100.0)
Platelets: 277 10*3/uL (ref 150–400)
RBC: 4.7 MIL/uL (ref 3.87–5.11)
RDW: 13.1 % (ref 11.5–15.5)
WBC: 7.9 10*3/uL (ref 4.0–10.5)
nRBC: 0 % (ref 0.0–0.2)

## 2021-12-20 LAB — TROPONIN I (HIGH SENSITIVITY): Troponin I (High Sensitivity): <2 ng/L (ref <18)

## 2021-12-20 MED ORDER — SODIUM CHLORIDE 0.9 % IV SOLN
1000.0000 mL | Freq: Once | INTRAVENOUS | Status: AC
  Administered 2021-12-20: 1000 mL via INTRAVENOUS

## 2021-12-20 MED ORDER — ONDANSETRON 4 MG PO TBDP: 4.0000 mg | ORAL_TABLET | Freq: Three times a day (TID) | ORAL | 0 refills | Status: AC | PRN

## 2021-12-20 MED ORDER — DIAZEPAM 5 MG/ML IJ SOLN
2.5000 mg | Freq: Once | INTRAMUSCULAR | Status: AC
  Administered 2021-12-20: 2.5 mg via INTRAVENOUS
  Filled 2021-12-20: qty 2

## 2021-12-20 MED ORDER — MECLIZINE HCL 25 MG PO TABS: 25.0000 mg | ORAL_TABLET | Freq: Three times a day (TID) | ORAL | 0 refills | Status: AC | PRN

## 2021-12-20 NOTE — ED Triage Notes (Signed)
Pt to ED via POV with complaints of N/V, dizziness and chest pain. Her dizziness started Friday. She is dizzy all the time has to look forward or she becomes dizzy and nauseated. Her chest pain is mid sternal and goes to her left side.  ?

## 2021-12-20 NOTE — ED Provider Notes (Signed)
? ?[facility] ?Provider Note ? ? ? Event Date/Time  ? First MD Initiated Contact with Patient 12/20/21 1215   ?  (approximate) ? ? ?History  ? ?Chest Pain, Dizziness, and Emesis ? ? ?HPI ? ?Katherine Eaton is a 46 y.o. female with a history of appendectomy, cholecystectomy presents with complaints of nausea vomiting fatigue.  Patient reports she has had nausea and vomiting and is now having dizziness.  She also reports having some diarrhea.  Denies abdominal pain at this time.  No fevers reported.  Does feel a sense of vertigo with abrupt movements of her head.  No neurodeficits.  Also reported some burning in her chest intermittently. ?  ? ? ?Physical Exam  ? ?Triage Vital Signs: ?ED Triage Vitals  ?Enc Vitals Group  ?   BP 12/20/21 1156 116/74  ?   Pulse Rate 12/20/21 1156 65  ?   Resp 12/20/21 1156 18  ?   Temp 12/20/21 1156 98.2 ?F (36.8 ?C)  ?   Temp Source 12/20/21 1156 Oral  ?   SpO2 12/20/21 1156 98 %  ?   Weight 12/20/21 1157 76 kg (167 lb 8.8 oz)  ?   Height 12/20/21 1157 1.6 m ('5\' 3"'$ )  ?   Head Circumference --   ?   Peak Flow --   ?   Pain Score 12/20/21 1156 6  ?   Pain Loc --   ?   Pain Edu? --   ?   Excl. in [place]? --   ? ? ?Most recent vital signs: ?Vitals:  ? 12/20/21 1156 12/20/21 1416  ?BP: 116/74 118/70  ?Pulse: 65 68  ?Resp: 18 18  ?Temp: 98.2 ?F (36.8 ?C)   ?SpO2: 98% 98%  ? ? ? ?General: Awake, no distress.  ?CV:  Good peripheral perfusion.  Regular rate and rhythm ?Resp:  Normal effort.  CTA bilaterally ?Abd:  No distention.  Soft, nontender ?Other:  No calf pain or swelling.  Neuro exam is normal, no dysdiadochokinesis ? ? ?ED Results / Procedures / Treatments  ? ?Labs ?(all labs ordered are listed, but only abnormal results are displayed) ?Labs Reviewed  ?BASIC METABOLIC PANEL  ?CBC  ?POC URINE PREG, ED  ?TROPONIN I (HIGH SENSITIVITY)  ? ? ? ?EKG ? ?ED ECG REPORT ?I, Lavonia Drafts, the attending physician, personally viewed and interpreted this  ECG. ? ?Date: 12/20/2021 ? ?Rhythm: normal sinus rhythm ?QRS Axis: normal ?Intervals: normal ?ST/T Wave abnormalities: normal ?Narrative Interpretation: no evidence of acute ischemia ? ? ? ?RADIOLOGY ? ?Chest x-ray viewed by me, no acute abnormality ? ? ?PROCEDURES: ? ?Critical Care performed:  ? ?Procedures ? ? ?MEDICATIONS ORDERED IN ED: ?Medications  ?0.9 %  sodium chloride infusion (0 mLs Intravenous Stopped 12/20/21 1415)  ?diazepam (VALIUM) injection 2.5 mg (2.5 mg Intravenous Given 12/20/21 1338)  ? ? ? ?IMPRESSION / MDM / ASSESSMENT AND PLAN / ED COURSE  ?I reviewed the triage vital signs and the nursing notes. ? ?Patient presents with dizziness, chest discomfort following multiple episodes of vomiting.  Suspect dehydration combined with acid reflux from vomiting.  Overall she is well-appearing and in no acute distress.  She does seem to have some vertiginous symptoms as well.  However her neuro exam is normal, most consistent with peripheral vertigo.  We will treat with IV fluids, IV Valium ? ?EKG is normal. ? ?High sensitive troponin is normal.  CBC and BMP are unremarkable. ? ?Chest x-ray without evidence  of pneumonia. ? ?Patient is feeling better after treatment, she and her husband report that they have to leave o go pick up her son from school.  They know they can return anytime, recommend outpatient follow-up with ENT for vertigo ? ? ? ? ? ?  ? ? ?FINAL CLINICAL IMPRESSION(S) / ED DIAGNOSES  ? ?Final diagnoses:  ?Vertigo  ? ? ? ?Rx / DC Orders  ? ?ED Discharge Orders   ? ?      Ordered  ?  meclizine (ANTIVERT) 25 MG tablet  3 times daily PRN       ? 12/20/21 1409  ?  ondansetron (ZOFRAN-ODT) 4 MG disintegrating tablet  Every 8 hours PRN       ? 12/20/21 1409  ? ?  ?  ? ?  ? ? ? ?Note:  This document was prepared using Dragon voice recognition software and may include unintentional dictation errors. ?  ?Lavonia Drafts, MD ?12/20/21 1518 ? ?

## 2023-12-29 IMAGING — CR DG CHEST 2V
1 series · 2 of 2 positions shown · non-contrast
Comparison: CT chest/abdomen/pelvis 08/01/2020. Prior chest
radiographs 06/12/2019 and earlier.

CLINICAL DATA: Chest pain. Additional history provided:
Nausea/vomiting, dizziness, chest pain.

EXAM:
CHEST - 2 VIEW

[Series 1: dg chest 2 view · 0.14mm/px · 2 of 2 slices shown]
[im 1/2]
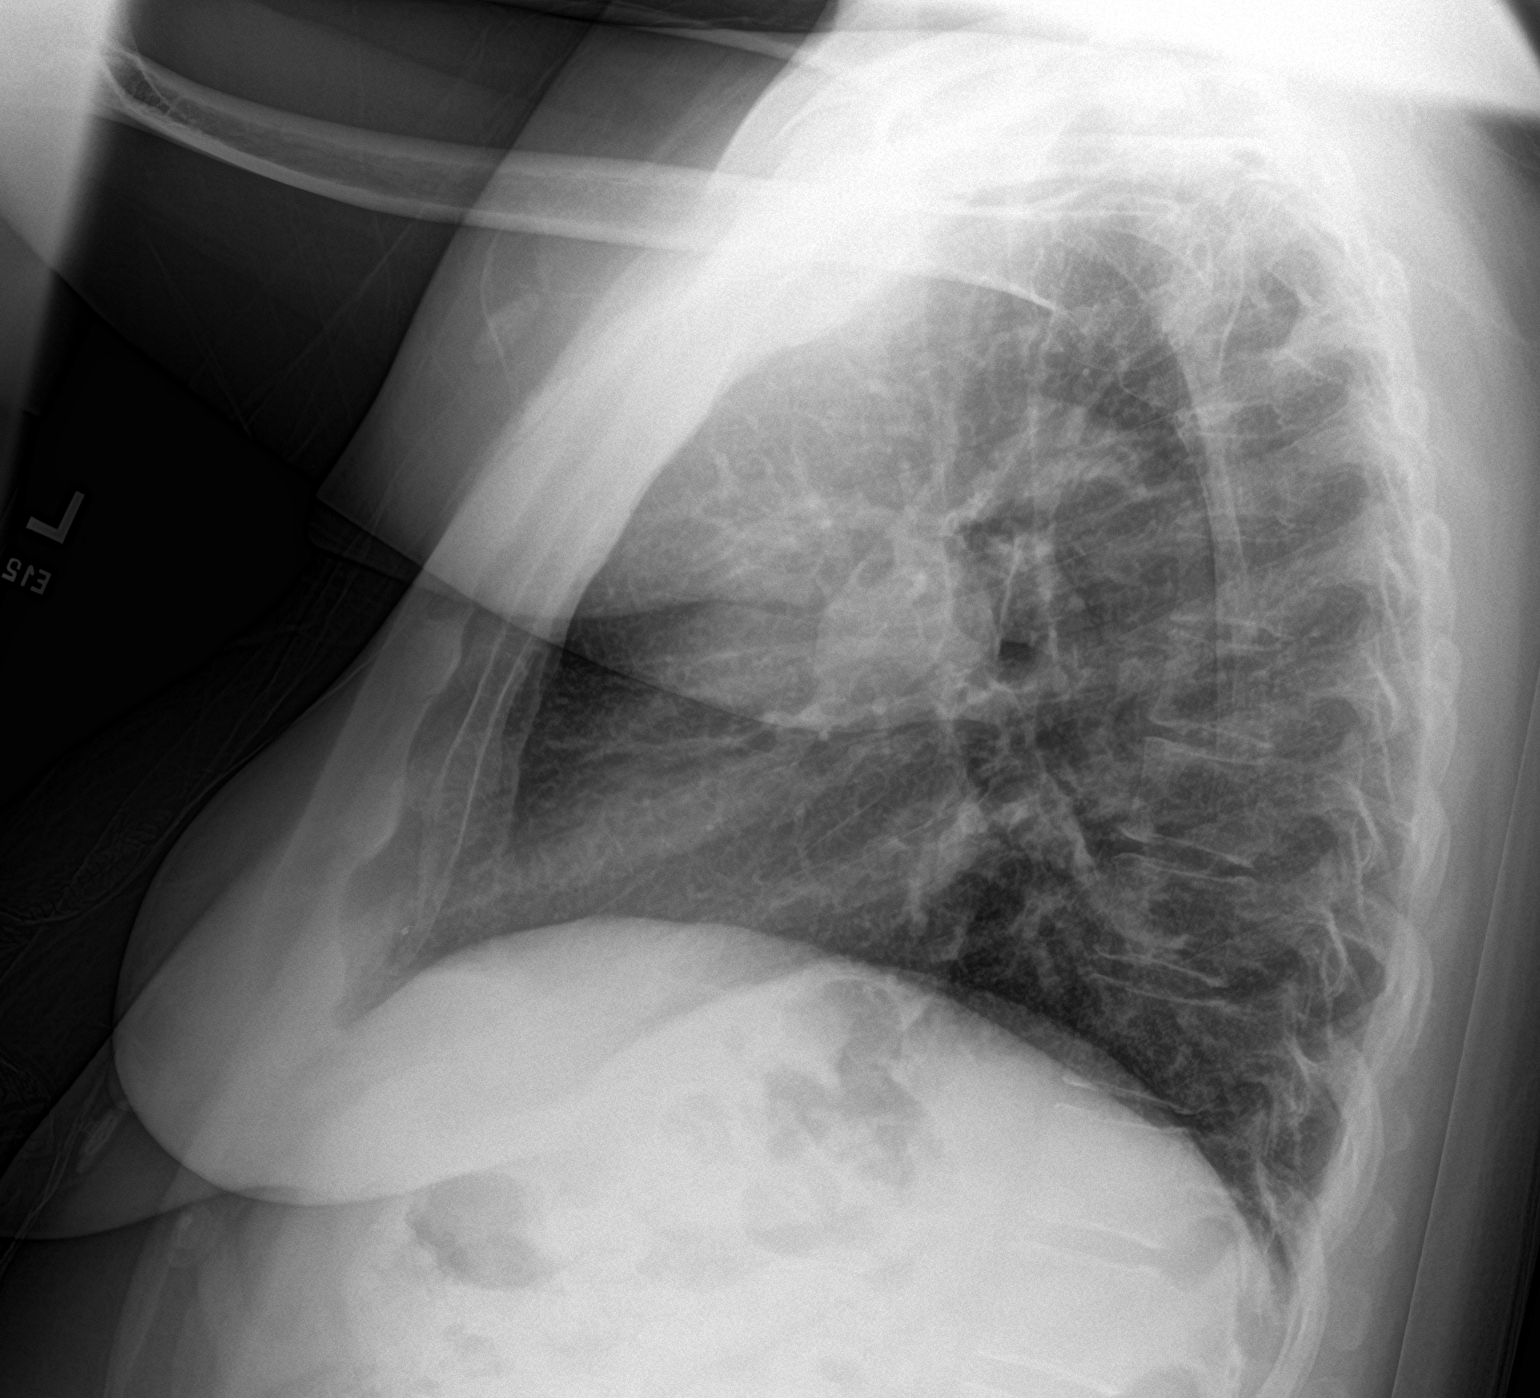
[im 2/2]
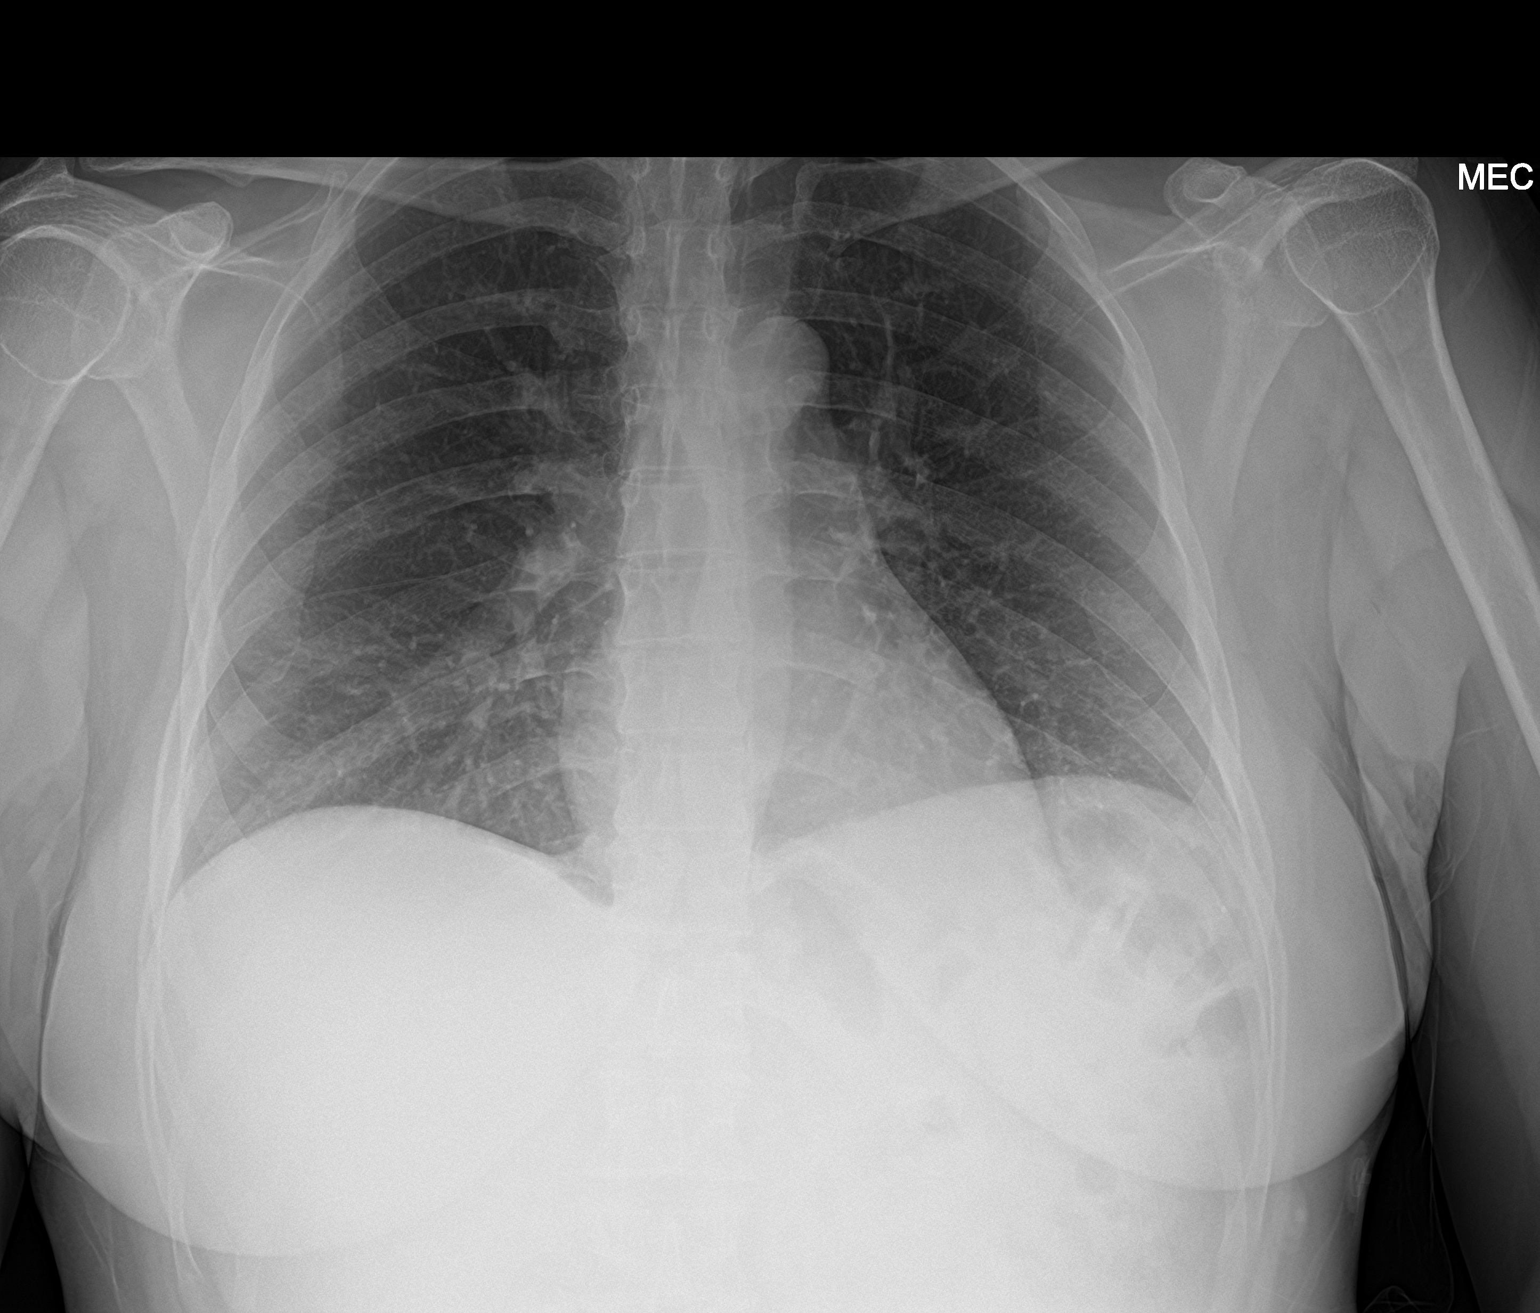

[2 of 2 positions shown; findings below may reference images not displayed]

FINDINGS: Heart size within normal limits. No appreciable airspace
consolidation. No evidence of pleural effusion or pneumothorax. No
acute bony abnormality identified.
IMPRESSION: No evidence of active cardiopulmonary disease.
